# Patient Record
Sex: Female | Born: 1995 | Hispanic: No | Marital: Single | Smoking: Former smoker
Health system: Southern US, Community
[De-identification: ages and names within clinical notes are randomized; demographics above are authoritative.]

## PROBLEM LIST (undated history)

## (undated) DIAGNOSIS — F32A Depression, unspecified: Secondary | ICD-10-CM

## (undated) DIAGNOSIS — R7303 Prediabetes: Secondary | ICD-10-CM

## (undated) DIAGNOSIS — Z0282 Encounter for adoption services: Secondary | ICD-10-CM

## (undated) DIAGNOSIS — M549 Dorsalgia, unspecified: Secondary | ICD-10-CM

## (undated) DIAGNOSIS — F909 Attention-deficit hyperactivity disorder, unspecified type: Secondary | ICD-10-CM

## (undated) DIAGNOSIS — R51 Headache: Secondary | ICD-10-CM

## (undated) DIAGNOSIS — Z789 Other specified health status: Secondary | ICD-10-CM

## (undated) DIAGNOSIS — F329 Major depressive disorder, single episode, unspecified: Secondary | ICD-10-CM

## (undated) DIAGNOSIS — E119 Type 2 diabetes mellitus without complications: Secondary | ICD-10-CM

## (undated) DIAGNOSIS — K219 Gastro-esophageal reflux disease without esophagitis: Secondary | ICD-10-CM

## (undated) DIAGNOSIS — R519 Headache, unspecified: Secondary | ICD-10-CM

## (undated) DIAGNOSIS — F419 Anxiety disorder, unspecified: Secondary | ICD-10-CM

## (undated) HISTORY — PX: TONSILLECTOMY: SUR1361

## (undated) HISTORY — DX: Dorsalgia, unspecified: M54.9

## (undated) HISTORY — PX: GANGLION CYST EXCISION: SHX1691

## (undated) HISTORY — PX: WISDOM TOOTH EXTRACTION: SHX21

## (undated) HISTORY — DX: Headache: R51

## (undated) HISTORY — DX: Headache, unspecified: R51.9

---
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
## 2016-04-09 DIAGNOSIS — F329 Major depressive disorder, single episode, unspecified: Secondary | ICD-10-CM | POA: Insufficient documentation

## 2016-04-09 DIAGNOSIS — F419 Anxiety disorder, unspecified: Secondary | ICD-10-CM | POA: Insufficient documentation

## 2016-04-09 DIAGNOSIS — F909 Attention-deficit hyperactivity disorder, unspecified type: Secondary | ICD-10-CM | POA: Insufficient documentation

## 2016-04-09 DIAGNOSIS — F32A Depression, unspecified: Secondary | ICD-10-CM | POA: Insufficient documentation

## 2016-04-11 ENCOUNTER — Encounter
Admission: RE | Admit: 2016-04-11 | Discharge: 2016-04-11 | Disposition: A | Discharge status: Home / Self Care | Payer: 59 | Source: Ambulatory Visit | Attending: Surgery | Admitting: Surgery

## 2016-04-11 HISTORY — DX: Depression, unspecified: F32.A

## 2016-04-11 HISTORY — DX: Type 2 diabetes mellitus without complications: E11.9

## 2016-04-11 HISTORY — DX: Encounter for adoption services: Z02.82

## 2016-04-11 HISTORY — DX: Attention-deficit hyperactivity disorder, unspecified type: F90.9

## 2016-04-11 HISTORY — DX: Prediabetes: R73.03

## 2016-04-11 HISTORY — DX: Gastro-esophageal reflux disease without esophagitis: K21.9

## 2016-04-11 HISTORY — DX: Anxiety disorder, unspecified: F41.9

## 2016-04-11 HISTORY — DX: Other specified health status: Z78.9

## 2016-04-11 HISTORY — DX: Major depressive disorder, single episode, unspecified: F32.9

## 2016-04-11 NOTE — Patient Instructions (Signed)
  Your procedure is scheduled on: 04-17-16 Report to Same Day Surgery 2nd floor medical mall To find out your arrival time please call [phone] between 1PM - 3PM on 04-16-16  Remember: Instructions that are not followed completely may result in serious medical risk, up to and including death, or upon the discretion of your surgeon and anesthesiologist your surgery may need to be rescheduled.    _x___ 1. Do not eat food or drink liquids after midnight. No gum chewing or hard candies.     __x__ 2. No Alcohol for 24 hours before or after surgery.   __x__3. No Smoking for 24 prior to surgery.   ____  4. Bring all medications with you on the day of surgery if instructed.    __x__ 5. Notify your doctor if there is any change in your medical condition     (cold, fever, infections).     Do not wear jewelry, make-up, hairpins, clips or nail polish.  Do not wear lotions, powders, or perfumes. You may wear deodorant.  Do not shave 48 hours prior to surgery. Men may shave face and neck.  Do not bring valuables to the hospital.    [facility] any belongings or valuables.               Contacts, dentures or bridgework may not be worn into surgery.  Leave your suitcase in the car. After surgery it may be brought to your room.  For patients admitted to the hospital, discharge time is determined by your treatment team.   Patients discharged the day of surgery will not be allowed to drive home.    Please read over the following fact sheets that you were given:   [facility] Preparing for Surgery and or MRSA Information   ____ Take these medicines the morning of surgery with A SIP OF WATER:    1. NONE  2.  3.  4.  5.  6.  ____ Fleet Enema (as directed)   ____ Use CHG Soap or sage wipes as directed on instruction sheet   ____ Use inhalers on the day of surgery and bring to hospital day of surgery  __X__ Stop metformin 2 days prior to surgery-LAST DOSE ON 04-14-16,  MONDAY   ____ Take 1/2 of usual insulin dose the night before surgery and none on the morning of           surgery.   ____ Stop aspirin or coumadin, or plavix  _x__ Stop Anti-inflammatories such as Advil, Aleve, Ibuprofen, Motrin, Naproxen,          Naprosyn, Goodies powders or aspirin products NOW- Ok to take Tylenol.   ____ Stop supplements until after surgery.    ____ Bring C-Pap to the hospital.

## 2016-04-16 ENCOUNTER — Encounter: Payer: Self-pay | Admitting: *Deleted

## 2016-04-17 ENCOUNTER — Ambulatory Visit
Admission: RE | Admit: 2016-04-17 | Discharge: 2016-04-17 | Disposition: A | Discharge status: Home / Self Care | Payer: 59 | Source: Ambulatory Visit | Attending: Surgery | Admitting: Surgery

## 2016-04-17 ENCOUNTER — Ambulatory Visit: Payer: 59 | Admitting: Anesthesiology

## 2016-04-17 ENCOUNTER — Encounter
Admission: RE | Disposition: A | Discharge status: Home / Self Care | Payer: Self-pay | Source: Ambulatory Visit | Attending: Surgery

## 2016-04-17 ENCOUNTER — Encounter: Payer: Self-pay | Admitting: *Deleted

## 2016-04-17 DIAGNOSIS — F418 Other specified anxiety disorders: Secondary | ICD-10-CM | POA: Diagnosis not present

## 2016-04-17 DIAGNOSIS — R7303 Prediabetes: Secondary | ICD-10-CM | POA: Diagnosis not present

## 2016-04-17 DIAGNOSIS — K219 Gastro-esophageal reflux disease without esophagitis: Secondary | ICD-10-CM | POA: Diagnosis not present

## 2016-04-17 DIAGNOSIS — F329 Major depressive disorder, single episode, unspecified: Secondary | ICD-10-CM | POA: Insufficient documentation

## 2016-04-17 DIAGNOSIS — L0501 Pilonidal cyst with abscess: Secondary | ICD-10-CM | POA: Diagnosis present

## 2016-04-17 DIAGNOSIS — F909 Attention-deficit hyperactivity disorder, unspecified type: Secondary | ICD-10-CM | POA: Insufficient documentation

## 2016-04-17 DIAGNOSIS — Z79899 Other long term (current) drug therapy: Secondary | ICD-10-CM | POA: Insufficient documentation

## 2016-04-17 HISTORY — PX: PILONIDAL CYST EXCISION: SHX744

## 2016-04-17 LAB — GLUCOSE, CAPILLARY: Glucose-Capillary: 100 mg/dL — ABNORMAL HIGH (ref 65–99)

## 2016-04-17 LAB — POCT PREGNANCY, URINE: Preg Test, Ur: NEGATIVE

## 2016-04-17 SURGERY — EXCISION, PILONIDAL CYST, EXTENSIVE
Anesthesia: General | Wound class: Contaminated

## 2016-04-17 MED ORDER — LIDOCAINE HCL (CARDIAC) 20 MG/ML IV SOLN
INTRAVENOUS | Status: DC | PRN
  Administered 2016-04-17: 100 mg via INTRAVENOUS

## 2016-04-17 MED ORDER — FAMOTIDINE 20 MG PO TABS
ORAL_TABLET | ORAL | Status: AC
  Administered 2016-04-17: 20 mg via ORAL
  Filled 2016-04-17: qty 1

## 2016-04-17 MED ORDER — DEXAMETHASONE SODIUM PHOSPHATE 10 MG/ML IJ SOLN
INTRAMUSCULAR | Status: DC | PRN
  Administered 2016-04-17: 10 mg via INTRAVENOUS

## 2016-04-17 MED ORDER — HYDROMORPHONE HCL 1 MG/ML IJ SOLN
INTRAMUSCULAR | Status: DC | PRN
  Administered 2016-04-17: .5 mg via INTRAVENOUS

## 2016-04-17 MED ORDER — HYDROCODONE-ACETAMINOPHEN 5-325 MG PO TABS: 1.0000 | ORAL_TABLET | ORAL | Status: DC | PRN

## 2016-04-17 MED ORDER — FENTANYL CITRATE (PF) 100 MCG/2ML IJ SOLN
25.0000 ug | INTRAMUSCULAR | Status: DC | PRN
  Administered 2016-04-17 (×4): 25 ug via INTRAVENOUS

## 2016-04-17 MED ORDER — BUPIVACAINE-EPINEPHRINE (PF) 0.5% -1:200000 IJ SOLN
INTRAMUSCULAR | Status: DC | PRN
  Administered 2016-04-17: 7 mL via PERINEURAL

## 2016-04-17 MED ORDER — METHYLENE BLUE 0.5 % INJ SOLN
INTRAVENOUS | Status: AC
  Filled 2016-04-17: qty 10

## 2016-04-17 MED ORDER — SUGAMMADEX SODIUM 200 MG/2ML IV SOLN
INTRAVENOUS | Status: DC | PRN
  Administered 2016-04-17: 300 mg via INTRAVENOUS

## 2016-04-17 MED ORDER — FAMOTIDINE 20 MG PO TABS
20.0000 mg | ORAL_TABLET | Freq: Once | ORAL | Status: AC
  Administered 2016-04-17: 20 mg via ORAL

## 2016-04-17 MED ORDER — PROPOFOL 10 MG/ML IV BOLUS
INTRAVENOUS | Status: DC | PRN
  Administered 2016-04-17: 50 mg via INTRAVENOUS
  Administered 2016-04-17: 150 mg via INTRAVENOUS

## 2016-04-17 MED ORDER — ROCURONIUM BROMIDE 100 MG/10ML IV SOLN
INTRAVENOUS | Status: DC | PRN
  Administered 2016-04-17: 10 mg via INTRAVENOUS
  Administered 2016-04-17: 35 mg via INTRAVENOUS

## 2016-04-17 MED ORDER — ONDANSETRON HCL 4 MG/2ML IJ SOLN: 4.0000 mg | Freq: Once | INTRAMUSCULAR | Status: DC | PRN

## 2016-04-17 MED ORDER — DEXTROSE 5 % IV SOLN
2.0000 g | Freq: Once | INTRAVENOUS | Status: AC
  Administered 2016-04-17: 2000 mg via INTRAVENOUS
  Filled 2016-04-17: qty 2

## 2016-04-17 MED ORDER — BUPIVACAINE-EPINEPHRINE (PF) 0.5% -1:200000 IJ SOLN
INTRAMUSCULAR | Status: AC
  Filled 2016-04-17: qty 30

## 2016-04-17 MED ORDER — FENTANYL CITRATE (PF) 100 MCG/2ML IJ SOLN
INTRAMUSCULAR | Status: DC | PRN
Start: 2016-04-17 — End: 2016-04-17
  Administered 2016-04-17: 100 ug via INTRAVENOUS

## 2016-04-17 MED ORDER — SODIUM CHLORIDE 0.9 % IV SOLN
INTRAVENOUS | Status: DC
  Administered 2016-04-17: 10:00:00 via INTRAVENOUS

## 2016-04-17 MED ORDER — FENTANYL CITRATE (PF) 100 MCG/2ML IJ SOLN
INTRAMUSCULAR | Status: AC
  Administered 2016-04-17: 25 ug via INTRAVENOUS
  Filled 2016-04-17: qty 2

## 2016-04-17 MED ORDER — LACTATED RINGERS IV SOLN
INTRAVENOUS | Status: DC | PRN
  Administered 2016-04-17: 10:00:00 via INTRAVENOUS

## 2016-04-17 MED ORDER — HYDROCODONE-ACETAMINOPHEN 5-325 MG PO TABS: 1.0000 | ORAL_TABLET | ORAL | 0 refills | Status: DC | PRN

## 2016-04-17 MED ORDER — ONDANSETRON HCL 4 MG/2ML IJ SOLN
INTRAMUSCULAR | Status: DC | PRN
  Administered 2016-04-17: 4 mg via INTRAVENOUS

## 2016-04-17 SURGICAL SUPPLY — 22 items
BLADE CLIPPER SURG (BLADE) IMPLANT
CANISTER SUCT 1200ML W/VALVE (MISCELLANEOUS) ×2 IMPLANT
CHLORAPREP W/TINT 26ML (MISCELLANEOUS) IMPLANT
DRAPE LAPAROTOMY 100X77 ABD (DRAPES) ×2 IMPLANT
ELECT REM PT RETURN 9FT ADLT (ELECTROSURGICAL) ×2
ELECTRODE REM PT RTRN 9FT ADLT (ELECTROSURGICAL) ×1 IMPLANT
GAUZE SPONGE 4X4 12PLY STRL (GAUZE/BANDAGES/DRESSINGS) ×2 IMPLANT
GLOVE BIO SURGEON STRL SZ7.5 (GLOVE) ×6 IMPLANT
GOWN STRL REUS W/ TWL LRG LVL3 (GOWN DISPOSABLE) ×3 IMPLANT
GOWN STRL REUS W/TWL LRG LVL3 (GOWN DISPOSABLE) ×3
LABEL OR SOLS (LABEL) IMPLANT
NEEDLE HYPO 25X1 1.5 SAFETY (NEEDLE) ×2 IMPLANT
NS IRRIG 500ML POUR BTL (IV SOLUTION) ×2 IMPLANT
PACK BASIN MINOR ARMC (MISCELLANEOUS) ×2 IMPLANT
PENCIL ELECTRO HAND CTR (MISCELLANEOUS) ×2 IMPLANT
STRAP SAFETY BODY (MISCELLANEOUS) ×2 IMPLANT
SUT CHROMIC 2 0 CT 1 (SUTURE) IMPLANT
SUT CHROMIC 3 0 SH 27 (SUTURE) IMPLANT
SUT ETHILON 3-0 FS-10 30 BLK (SUTURE) ×2
SUTURE EHLN 3-0 FS-10 30 BLK (SUTURE) ×1 IMPLANT
SWABSTK COMLB BENZOIN TINCTURE (MISCELLANEOUS) IMPLANT
SYRINGE 10CC LL (SYRINGE) ×2 IMPLANT

## 2016-04-17 NOTE — Op Note (Signed)
OPERATIVE REPORT  PREOPERATIVE  DIAGNOSIS: . Pilonidal cyst  POSTOPERATIVE DIAGNOSIS: . Pilonidal cyst  PROCEDURE: . Excision pilonidal cyst  ANESTHESIA:  General  SURGEON: Renda RollsWilton Alassane Kalafut  MD   INDICATIONS: . She has a history of multiple episodes of pain and drainage of pilonidal cyst. Surgery is recommended for definitive treatment.  With the patient on a stretcher in the supine position under general endotracheal anesthesia she was then rolled over to the operating table in the prone position. Buttocks were retracted with 3 inch silk tape. The intergluteal cleft was repaired with Betadine solution and draped in a sterile manner. There were 3 pores noted in the skin. A probe was inserted into the most inferior pore which went in a cephalad direction approximately 3 cm  demonstrating the extent of the cyst. An elliptical excision was carried out from just below the pore to just above the drainage site which was oriented slightly to the left of midline. The excision was then carried out with electrocautery dissecting around the probe and around the cyst. The cyst was excised. The cyst and surrounding skin and adipose tissue were submitted for pathology. The wound was inspected. Several small bleeding points were cauterized. Hemostasis was subsequently intact. The wound was infiltrated with half percent Sensorcaine with epinephrine. The wound was closed with interrupted 3-0 nylon vertical mattress sutures placing the sutures far enough apart to allow for drainage. The wound was dressed with Betadine paint, folded cotton gauze, benzoin and 3 inch silk tape. The patient tolerated surgery satisfactorily and was then prepared for transfer to the recovery room  [facility] Lauriel Helin M.D.

## 2016-04-17 NOTE — Discharge Instructions (Addendum)
Take Tylenol or Norco if needed for pain.  Remove dressing on Friday.  Shower daily and blot dry.  Limit activities as discussed.

## 2016-04-17 NOTE — Progress Notes (Signed)
.   Smith into see and check   Dressing dry and pt ready for discharge without complaint

## 2016-04-17 NOTE — Anesthesia Procedure Notes (Signed)
Procedure Name: Intubation Date/Time: 04/17/2016 10:36 AM Performed by: Almeta MonasFLETCHER, Shanetta Nicolls Pre-anesthesia Checklist: Patient identified, Emergency Drugs available, Suction available and Patient being monitored Patient Re-evaluated:Patient Re-evaluated prior to inductionOxygen Delivery Method: Circle system utilized Preoxygenation: Pre-oxygenation with 100% oxygen Intubation Type: IV induction Ventilation: Mask ventilation without difficulty Laryngoscope Size: Mac and 3 Grade View: Grade I Tube type: Oral Tube size: 7.0 mm Number of attempts: 1 Airway Equipment and Method: Stylet Placement Confirmation: ETT inserted through vocal cords under direct vision,  positive ETCO2,  CO2 detector and breath sounds checked- equal and bilateral Secured at: 19 cm Tube secured with: Tape Dental Injury: Teeth and Oropharynx as per pre-operative assessment

## 2016-04-17 NOTE — Anesthesia Preprocedure Evaluation (Signed)
Anesthesia Evaluation  Patient identified by MRN, date of birth, ID band Patient awake    Reviewed: Allergy & Precautions, NPO status , Patient's Chart, lab work & pertinent test results  History of Anesthesia Complications Negative for: history of anesthetic complications  Airway Mallampati: II       Dental   Pulmonary neg pulmonary ROS,           Cardiovascular negative cardio ROS       Neuro/Psych Anxiety Depression negative neurological ROS     GI/Hepatic Neg liver ROS, GERD  Medicated,  Endo/Other  diabetes, Oral Hypoglycemic Agents  Renal/GU negative Renal ROS     Musculoskeletal   Abdominal   Peds  Hematology negative hematology ROS (+)   Anesthesia Other Findings   Reproductive/Obstetrics                             Anesthesia Physical Anesthesia Plan  ASA: II  Anesthesia Plan: General   Post-op Pain Management:    Induction: Intravenous  Airway Management Planned: Oral ETT  Additional Equipment:   Intra-op Plan:   Post-operative Plan:   Informed Consent: I have reviewed the patients History and Physical, chart, labs and discussed the procedure including the risks, benefits and alternatives for the proposed anesthesia with the patient or authorized representative who has indicated his/her understanding and acceptance.     Plan Discussed with:   Anesthesia Plan Comments:         Anesthesia Quick Evaluation

## 2016-04-17 NOTE — Transfer of Care (Signed)
Immediate Anesthesia Transfer of Care Note  Patient: Allison Austin  Procedure(s) Performed: Procedure(s): CYST EXCISION PILONIDAL EXTENSIVE (N/A)  Patient Location: PACU  Anesthesia Type:General  Level of Consciousness: sedated  Airway & Oxygen Therapy: Patient Spontanous Breathing and Patient connected to face mask oxygen  Post-op Assessment: Report given to RN and Post -op Vital signs reviewed and stable  Post vital signs: Reviewed and stable  Last Vitals:  Vitals:   04/17/16 0948 04/17/16 1148  BP: 114/67 127/89  Pulse: 85 96  Resp:  14  Temp: 36.6 C 36.1 C    Last Pain:  Vitals:   04/17/16 0948  TempSrc: Oral         Complications: No apparent anesthesia complications

## 2016-04-17 NOTE — Anesthesia Postprocedure Evaluation (Signed)
Anesthesia Post Note  Patient: Allison Austin  Procedure(s) Performed: Procedure(s) (LRB): CYST EXCISION PILONIDAL EXTENSIVE (N/A)  Patient location during evaluation: PACU Anesthesia Type: General Level of consciousness: awake and alert Pain management: pain level controlled Vital Signs Assessment: post-procedure vital signs reviewed and stable Respiratory status: spontaneous breathing and respiratory function stable Cardiovascular status: stable Anesthetic complications: no    Last Vitals:  Vitals:   04/17/16 1208 04/17/16 1218  BP: 132/80 123/81  Pulse: 79 76  Resp: 15 15  Temp:      Last Pain:  Vitals:   04/17/16 1220  TempSrc:   PainSc: 2                  Miquel Lamson K

## 2016-04-17 NOTE — H&P (Signed)
  She reports continued discomfort in the intergluteal cleft that no change in overall condition since the day of the office visit.  CBC on 04/08/2016 with hemoglobin of 13.7 and platelet count 307,000. Chemistry profile was normal with creatinine of 0.7. Urine pregnancy test today is negative  I discussed the plan for excision of pilonidal cyst and also discussed perioperative care

## 2016-04-18 LAB — SURGICAL PATHOLOGY

## 2016-04-18 NOTE — Addendum Note (Signed)
Addendum  created 04/18/16 1404 by Almeta MonasPatricia Chezney Huether, CRNA   Anesthesia Intra Meds edited

## 2016-04-22 ENCOUNTER — Encounter: Payer: Self-pay | Admitting: Surgery

## 2016-06-17 ENCOUNTER — Encounter: Payer: Self-pay | Admitting: Emergency Medicine

## 2016-06-17 ENCOUNTER — Emergency Department
Admission: EM | Admit: 2016-06-17 | Discharge: 2016-06-17 | Disposition: A | Discharge status: Home / Self Care | Payer: 59 | Attending: Student in an Organized Health Care Education/Training Program | Admitting: Student in an Organized Health Care Education/Training Program

## 2016-06-17 DIAGNOSIS — R55 Syncope and collapse: Secondary | ICD-10-CM | POA: Insufficient documentation

## 2016-06-17 DIAGNOSIS — Z7984 Long term (current) use of oral hypoglycemic drugs: Secondary | ICD-10-CM | POA: Insufficient documentation

## 2016-06-17 DIAGNOSIS — Z79899 Other long term (current) drug therapy: Secondary | ICD-10-CM | POA: Diagnosis not present

## 2016-06-17 DIAGNOSIS — E119 Type 2 diabetes mellitus without complications: Secondary | ICD-10-CM | POA: Insufficient documentation

## 2016-06-17 DIAGNOSIS — F909 Attention-deficit hyperactivity disorder, unspecified type: Secondary | ICD-10-CM | POA: Diagnosis not present

## 2016-06-17 LAB — URINALYSIS COMPLETE WITH MICROSCOPIC (ARMC ONLY)
Bilirubin Urine: NEGATIVE
Glucose, UA: NEGATIVE mg/dL
Hgb urine dipstick: NEGATIVE
Ketones, ur: NEGATIVE mg/dL
Nitrite: NEGATIVE
Protein, ur: NEGATIVE mg/dL
Specific Gravity, Urine: 1.014 (ref 1.005–1.030)
pH: 6 (ref 5.0–8.0)

## 2016-06-17 LAB — CBC
HCT: 42.2 % (ref 35.0–47.0)
Hemoglobin: 14.3 g/dL (ref 12.0–16.0)
MCH: 28.2 pg (ref 26.0–34.0)
MCHC: 33.9 g/dL (ref 32.0–36.0)
MCV: 83.2 fL (ref 80.0–100.0)
Platelets: 309 10*3/uL (ref 150–440)
RBC: 5.07 MIL/uL (ref 3.80–5.20)
RDW: 14.8 % — ABNORMAL HIGH (ref 11.5–14.5)
WBC: 10.6 10*3/uL (ref 3.6–11.0)

## 2016-06-17 LAB — POCT PREGNANCY, URINE: Preg Test, Ur: NEGATIVE

## 2016-06-17 LAB — BASIC METABOLIC PANEL
Anion gap: 8 (ref 5–15)
BUN: 8 mg/dL (ref 6–20)
CHLORIDE: 108 mmol/L (ref 101–111)
CO2: 22 mmol/L (ref 22–32)
Calcium: 9.2 mg/dL (ref 8.9–10.3)
Creatinine, Ser: 0.67 mg/dL (ref 0.44–1.00)
Glucose, Bld: 97 mg/dL (ref 65–99)
POTASSIUM: 3.9 mmol/L (ref 3.5–5.1)
SODIUM: 138 mmol/L (ref 135–145)

## 2016-06-17 LAB — LIPASE, BLOOD: Lipase: 28 U/L (ref 11–51)

## 2016-06-17 MED ORDER — NITROFURANTOIN MONOHYD MACRO 100 MG PO CAPS: 100.0000 mg | ORAL_CAPSULE | Freq: Two times a day (BID) | ORAL | 0 refills | Status: AC

## 2016-06-17 MED ORDER — PROMETHAZINE HCL 25 MG PO TABS
25.0000 mg | ORAL_TABLET | Freq: Once | ORAL | Status: AC
  Administered 2016-06-17: 25 mg via ORAL
  Filled 2016-06-17: qty 1

## 2016-06-17 NOTE — ED Notes (Signed)
Pt presents after having "blackout" for 6 seconds while on the toilet this morning. Pt called her mother, who told her to take her blood sugar, which was normal. She called her MD office, who told her to come here. Pt states she may have alcohol poisoning from drinking on Friday. She has not had a drink since then, but states that she drank "a lot" on Friday. She states that her vision is blurred and that she has had palpitations today. She also states she has had a headache since this morning.

## 2016-06-17 NOTE — ED Notes (Signed)
Pt discharged home after verbalizing understanding of discharge instructions; nad noted. 

## 2016-06-17 NOTE — ED Triage Notes (Signed)
States had a syncopal episode this morning while on the toilet.  Later began to have a headache.  Also c/o intermittent blurred vision and heart palpitations.  Patient also c/o 1 episode ov emesis just PTA and nausea.

## 2016-06-17 NOTE — ED Provider Notes (Signed)
[facility] Emergency Department Provider Note    First MD Initiated Contact with Patient 06/17/16 1354     (approximate)  I have reviewed the triage vital signs and the nursing notes.   HISTORY  Chief Complaint Loss of Consciousness    HPI Etta Truett MainlandOBryan is a 20 y.o. female who presents with near syncopal episode that occurred this morning while she was urinating on the toilet. States that she felt her vision blacked out for roughly 5-6 seconds. She did not fully lose consciousness that she did not fall from the toilet. She denies any chest pain or shortness of breath. States that she was feeling nauseous this morning. No dysuria.  She felt that her heart was racing and called her PCP who directed her to the Er.  Patient states that she's been having blurry vision since she was a young child. Denies any visual disturbances at this time. Denies any headache at this time. Denies any chest pain or shortness of breath. No numbness or tingling.   Past Medical History:  Diagnosis Date  . ADHD (attention deficit hyperactivity disorder)   . Adopted   . Anxiety   . Depression   . Diabetes mellitus without complication (HCC)   . GERD (gastroesophageal reflux disease)    OCC- NO MEDS  . Pre-diabetes     There are no active problems to display for this patient.   Past Surgical History:  Procedure Laterality Date  . GANGLION CYST EXCISION    . PILONIDAL CYST EXCISION N/A 04/17/2016   Procedure: CYST EXCISION PILONIDAL EXTENSIVE;  Surgeon: Nadeen LandauJarvis Wilton Smith, MD;  Location: [facility] ORS;  Service: General;  Laterality: N/A;  . TONSILLECTOMY    . WISDOM TOOTH EXTRACTION      Prior to Admission medications   Medication Sig Start Date End Date Taking? Authorizing Provider  Amphetamine-Dextroamphetamine (ADDERALL PO) Take 25 mg by mouth daily.    Historical Provider, MD  citalopram (CELEXA) 40 MG tablet Take 40 mg by mouth at bedtime.  04/12/15   Historical Provider,  MD  doxycycline (VIBRA-TABS) 100 MG tablet Take 100 mg by mouth 2 (two) times daily.    Historical Provider, MD  etonogestrel (NEXPLANON) 68 MG IMPL implant 1 each by Subdermal route once.    Historical Provider, MD  HYDROcodone-acetaminophen (NORCO) 5-325 MG tablet Take 1-2 tablets by mouth every 4 (four) hours as needed for moderate pain. 04/17/16   Nadeen LandauJarvis Wilton Smith, MD  metFORMIN (GLUCOPHAGE) 500 MG tablet Take 1,000 mg by mouth daily.    Historical Provider, MD  nitrofurantoin, macrocrystal-monohydrate, (MACROBID) 100 MG capsule Take 1 capsule (100 mg total) by mouth 2 (two) times daily. 06/17/16 06/20/16  Willy EddyPatrick Demitrius Crass, MD  Omega-3 Fatty Acids (FISH OIL) 1000 MG CAPS Take 1 capsule by mouth daily.    Historical Provider, MD    Allergies Patient has no known allergies.  No family history on file.  Social History Social History  Substance Use Topics  . Smoking status: Never Smoker  . Smokeless tobacco: Never Used  . Alcohol use No    Review of Systems Patient denies headaches, rhinorrhea, blurry vision, numbness, shortness of breath, chest pain, edema, cough, abdominal pain, nausea, vomiting, diarrhea, dysuria, fevers, rashes or hallucinations unless otherwise stated above in HPI. ____________________________________________   PHYSICAL EXAM:  VITAL SIGNS: Vitals:   06/17/16 1217 06/17/16 1452  BP: 114/80 (!) 119/92  Pulse: 94 96  Resp: 16 18    Constitutional: Alert and oriented. Well appearing  and in no acute distress. Eyes: Conjunctivae are normal. PERRL. EOMI. Head: Atraumatic. Nose: No congestion/rhinnorhea. Mouth/Throat: Mucous membranes are moist.  Oropharynx non-erythematous. Neck: No stridor. Painless ROM. No cervical spine tenderness to palpation Hematological/Lymphatic/Immunilogical: No cervical lymphadenopathy. Cardiovascular: Normal rate, regular rhythm. Grossly normal heart sounds.  Good peripheral circulation. Respiratory: Normal respiratory effort.   No retractions. Lungs CTAB. Gastrointestinal: Soft and nontender. No distention. No abdominal bruits. No CVA tenderness. Genitourinary:  Musculoskeletal: No lower extremity tenderness nor edema.  No joint effusions. Neurologic:  CN- intact.  No facial droop, Normal FNF.  Normal heel to shin.  Sensation intact bilaterally. Normal speech and language. No gross focal neurologic deficits are appreciated. No gait instability. Skin:  Skin is warm, dry and intact. No rash noted. Psychiatric: Mood and affect are normal. Speech and behavior are normal.  ____________________________________________   LABS (all labs ordered are listed, but only abnormal results are displayed)  Results for orders placed or performed during the hospital encounter of 06/17/16 (from the past 24 hour(s))  Basic metabolic panel     Status: None   Collection Time: 06/17/16 12:20 PM  Result Value Ref Range   Sodium 138 135 - 145 mmol/L   Potassium 3.9 3.5 - 5.1 mmol/L   Chloride 108 101 - 111 mmol/L   CO2 22 22 - 32 mmol/L   Glucose, Bld 97 65 - 99 mg/dL   BUN 8 6 - 20 mg/dL   Creatinine, Ser 9.60 0.44 - 1.00 mg/dL   Calcium 9.2 8.9 - 45.4 mg/dL   GFR calc non Af Amer >60 >60 mL/min   GFR calc Af Amer >60 >60 mL/min   Anion gap 8 5 - 15  CBC     Status: Abnormal   Collection Time: 06/17/16 12:20 PM  Result Value Ref Range   WBC 10.6 3.6 - 11.0 K/uL   RBC 5.07 3.80 - 5.20 MIL/uL   Hemoglobin 14.3 12.0 - 16.0 g/dL   HCT 09.8 11.9 - 14.7 %   MCV 83.2 80.0 - 100.0 fL   MCH 28.2 26.0 - 34.0 pg   MCHC 33.9 32.0 - 36.0 g/dL   RDW 82.9 (H) 56.2 - 13.0 %   Platelets 309 150 - 440 K/uL  Urinalysis complete, with microscopic     Status: Abnormal   Collection Time: 06/17/16 12:20 PM  Result Value Ref Range   Color, Urine YELLOW (A) YELLOW   APPearance HAZY (A) CLEAR   Glucose, UA NEGATIVE NEGATIVE mg/dL   Bilirubin Urine NEGATIVE NEGATIVE   Ketones, ur NEGATIVE NEGATIVE mg/dL   Specific Gravity, Urine 1.014 1.005 -  1.030   Hgb urine dipstick NEGATIVE NEGATIVE   pH 6.0 5.0 - 8.0   Protein, ur NEGATIVE NEGATIVE mg/dL   Nitrite NEGATIVE NEGATIVE   Leukocytes, UA TRACE (A) NEGATIVE   RBC / HPF 0-5 0 - 5 RBC/hpf   WBC, UA 0-5 0 - 5 WBC/hpf   Bacteria, UA MANY (A) NONE SEEN   Squamous Epithelial / LPF 0-5 (A) NONE SEEN   Mucous PRESENT   Lipase, blood     Status: None   Collection Time: 06/17/16 12:20 PM  Result Value Ref Range   Lipase 28 11 - 51 U/L  Pregnancy, urine POC     Status: None   Collection Time: 06/17/16 12:38 PM  Result Value Ref Range   Preg Test, Ur NEGATIVE NEGATIVE   ____________________________________________  EKG My review and personal interpretation at Time: 12:29   Indication: syncope  Rate: 79  Rhythm: sinuis Axis: normal Other: normal intervals, no brugada or wpw ____________________________________________   ____________________________________________   PROCEDURES  Procedure(s) performed: none    Critical Care performed: no ____________________________________________   INITIAL IMPRESSION / ASSESSMENT AND PLAN / ED COURSE  Pertinent labs & imaging results that were available during my care of the patient were reviewed by me and considered in my medical decision making (see chart for details).  DDX: dehydration, vasovagal, dysrhythmia, pna, electrolyte abn  Mieke Truett MainlandOBryan is a 20 y.o. who presents to the ED with Near syncopal episode as described above.  Patient is AFVSS in ED. Exam as above. Given current presentation have considered the above differential.  Patient is well-appearing and in no acute distress. Electrolyte panel ordered to evaluate for acute abnormality is reassuring. EKG shows no evidence of dysrhythmia or preexcitation syndrome.  Patient denies any shortness of breath to indicate any pulmonary pathology. She is normal neuro exam with normal crisp optic disks bilaterally. Do not feel CT imaging clinically indicated at this time.   Presentation most consistent with vasovagal episode. No evidence of seizure-like activity.  Patient is not pregnant therefore not ectopic.  Patient able to tolerate oral hydration and able to [facility] steady gait.  Have discussed with the patient and available family all diagnostics and treatments performed thus far and all questions were answered to the best of my ability. The patient demonstrates understanding and agreement with plan.   Clinical Course as of Jun 17 1629  Tue Jun 17, 2016  1355 Pulse Rate: 94 [PR]    Clinical Course User Index [PR] Willy EddyPatrick Zyiah Withington, MD     ____________________________________________   FINAL CLINICAL IMPRESSION(S) / ED DIAGNOSES  Final diagnoses:  Near syncope      NEW MEDICATIONS STARTED DURING THIS VISIT:  Discharge Medication List as of 06/17/2016  2:42 PM       Note:  This document was prepared using Dragon voice recognition software and may include unintentional dictation errors.    Willy EddyPatrick Quanell Loughney, MD 06/17/16 [phone]

## 2017-07-06 ENCOUNTER — Encounter: Payer: Self-pay | Admitting: Obstetrics and Gynecology

## 2017-07-06 ENCOUNTER — Ambulatory Visit (INDEPENDENT_AMBULATORY_CARE_PROVIDER_SITE_OTHER): Payer: 59 | Admitting: Obstetrics and Gynecology

## 2017-07-06 VITALS — BP 130/70 | HR 104 | Ht 62.0 in | Wt 255.0 lb

## 2017-07-06 DIAGNOSIS — Z0000 Encounter for general adult medical examination without abnormal findings: Secondary | ICD-10-CM

## 2017-07-06 DIAGNOSIS — Z3046 Encounter for surveillance of implantable subdermal contraceptive: Secondary | ICD-10-CM

## 2017-07-06 DIAGNOSIS — Z113 Encounter for screening for infections with a predominantly sexual mode of transmission: Secondary | ICD-10-CM

## 2017-07-06 NOTE — Progress Notes (Signed)
Gynecology Annual Exam  PCP: Patient, No Pcp Per  Chief Complaint:  Chief Complaint  Patient presents with  . Gynecologic Exam    History of Present Illness: Patient is a 21 y.o. G0P0000 presents for annual exam. The patient has no complaints today.   LMP: Patient's last menstrual period was 06/27/2017 (exact date). Average Interval: irregular, not applicable days Duration of flow: 2 days Heavy Menses: no Clots: no Intermenstrual Bleeding: no Postcoital Bleeding: no Dysmenorrhea: no   The patient is not currently sexually active. She currently uses Nexplanon for contraception. She denies dyspareunia.  The patient does perform self breast exams.  The patient is adopted so she is not sure about her family history of breast, uterine, or ovarian cancer.   The patient wears seatbelts: yes.   The patient walks 2-3 times a week with a friend for exercise..    The patient denies current symptoms of depression.    Review of Systems: Review of Systems  Constitutional: Negative for chills, fever, malaise/fatigue and weight loss.  HENT: Negative for congestion, hearing loss and sinus pain.   Eyes: Negative for blurred vision and double vision.  Respiratory: Negative for cough, sputum production, shortness of breath and wheezing.   Cardiovascular: Negative for chest pain, palpitations, orthopnea and leg swelling.  Gastrointestinal: Negative for abdominal pain, constipation, diarrhea, nausea and vomiting.  Genitourinary: Negative for dysuria, flank pain, frequency, hematuria and urgency.  Musculoskeletal: Negative for back pain, falls and joint pain.  Skin: Negative for itching and rash.  Neurological: Negative for dizziness and headaches.  Psychiatric/Behavioral: Negative for depression, substance abuse and suicidal ideas. The patient is not nervous/anxious.     Past Medical History:  Past Medical History:  Diagnosis Date  . ADHD (attention deficit hyperactivity disorder)   .  Adopted   . Anxiety   . Depression   . Diabetes mellitus without complication (HCC)   . GERD (gastroesophageal reflux disease)    OCC- NO MEDS  . Pre-diabetes     Past Surgical History:  Past Surgical History:  Procedure Laterality Date  . GANGLION CYST EXCISION    . PILONIDAL CYST EXCISION N/A 04/17/2016   Procedure: CYST EXCISION PILONIDAL EXTENSIVE;  Surgeon: Nadeen LandauJarvis Wilton Smith, MD;  Location: [facility] ORS;  Service: General;  Laterality: N/A;  . TONSILLECTOMY    . WISDOM TOOTH EXTRACTION      Gynecologic History:  Patient's last menstrual period was 06/27/2017 (exact date). Contraception: Nexplanon Last Pap: Not applicable, patient is under 21.  Obstetric History: G0P0000  Family History:  Family History  Adopted: Yes  Problem Relation Age of Onset  . Testicular cancer Maternal Uncle   . Diabetes Maternal Grandmother   . Uterine cancer Maternal Grandmother     Social History:  Social History   Socioeconomic History  . Marital status: Single    Spouse name: Not on file  . Number of children: Not on file  . Years of education: Not on file  . Highest education level: Not on file  Social Needs  . Financial resource strain: Not on file  . Food insecurity - worry: Not on file  . Food insecurity - inability: Not on file  . Transportation needs - medical: Not on file  . Transportation needs - non-medical: Not on file  Occupational History  . Not on file  Tobacco Use  . Smoking status: Never Smoker  . Smokeless tobacco: Never Used  Substance and Sexual Activity  . Alcohol use: No  Alcohol/week: 0.0 oz  . Drug use: No  . Sexual activity: Not Currently    Birth control/protection: Implant  Other Topics Concern  . Not on file  Social History Narrative  . Not on file    Allergies:  No Known Allergies  Medications: Prior to Admission medications   Medication Sig Start Date End Date Taking? Authorizing Provider  etonogestrel (NEXPLANON) 68 MG IMPL implant 1  each by Subdermal route once.    Yes [provider]  Multiple Vitamin (MULTI-VITAMINS) TABS Take by mouth.   Yes [provider]  triamcinolone cream (KENALOG) 0.1 %  05/13/17  Yes [provider]    Physical Exam Vitals: Blood pressure 130/70, pulse (!) 104, height 5\' 2"  (1.575 m), weight 255 lb (115.7 kg), last menstrual period 06/27/2017.  Physical Exam  Constitutional: She is oriented to person, place, and time. She appears well-developed.  Genitourinary: Vagina normal and uterus normal. There is no rash, tenderness or lesion on the right labia. There is no rash, tenderness or lesion on the left labia. Vagina exhibits no lesion. Right adnexum does not display mass. Left adnexum does not display mass. Cervix does not exhibit motion tenderness.  HENT:  Head: Normocephalic and atraumatic.  Eyes: EOM are normal.  Neck: Neck supple. No thyromegaly present.  Cardiovascular: Normal rate, regular rhythm and normal heart sounds.  Pulmonary/Chest: Effort normal and breath sounds normal. Right breast exhibits no inverted nipple, no mass, no nipple discharge and no skin change. Left breast exhibits no inverted nipple, no mass, no nipple discharge and no skin change.  Abdominal: Soft. Bowel sounds are normal. She exhibits no distension and no mass.  Neurological: She is alert and oriented to person, place, and time.  Skin: Skin is warm and dry.  Psychiatric: She has a normal mood and affect. Her behavior is normal. Judgment and thought content normal.  Vitals reviewed.    Female chaperone present for pelvic and breast  portions of the physical exam  Assessment: 21 y.o. G0P0000 routine annual exam  Plan: Problem List Items Addressed This Visit    None    Visit Diagnoses    Screening examination for STD (sexually transmitted disease)    -  Primary   Relevant Orders   NuSwab Vaginitis Plus (VG+)   Health care maintenance       Relevant Orders   NuSwab Vaginitis  Plus (VG+)      1) STI screening was offered and accepted. Nuswab VG+ sent.    2) Contraception - Education given regarding options for contraception, including barrier methods, IUD placement, oral contraceptives, Nexplanon. She desires to continue her current form of contraception with the Nexplanon.  3) Routine healthcare maintenance including cholesterol, diabetes screening discussed managed by PCP   Patient gave permission to leave detailed verbal messages on her phone regarding her care today.

## 2017-07-09 ENCOUNTER — Other Ambulatory Visit: Payer: Self-pay | Admitting: Obstetrics and Gynecology

## 2017-07-09 LAB — NUSWAB VAGINITIS PLUS (VG+)
CANDIDA ALBICANS, NAA: POSITIVE — AB
CANDIDA GLABRATA, NAA: NEGATIVE
Chlamydia trachomatis, NAA: NEGATIVE
Neisseria gonorrhoeae, NAA: NEGATIVE
TRICH VAG BY NAA: NEGATIVE

## 2017-07-09 MED ORDER — FLUCONAZOLE 150 MG PO TABS: 150.0000 mg | ORAL_TABLET | Freq: Once | ORAL | 0 refills | Status: AC

## 2017-07-30 ENCOUNTER — Telehealth: Payer: Self-pay | Admitting: Obstetrics and Gynecology

## 2017-07-30 ENCOUNTER — Encounter: Payer: Self-pay | Admitting: Obstetrics and Gynecology

## 2017-07-30 ENCOUNTER — Other Ambulatory Visit: Payer: Self-pay | Admitting: Obstetrics and Gynecology

## 2017-07-30 DIAGNOSIS — N760 Acute vaginitis: Secondary | ICD-10-CM

## 2017-07-30 MED ORDER — TERCONAZOLE 0.4 % VA CREA: 1.0000 | TOPICAL_CREAM | Freq: Every day | VAGINAL | 0 refills | Status: DC

## 2017-07-30 NOTE — Telephone Encounter (Signed)
Patient scheduled 12/27 for nexplanon remove/replace with Schuman.

## 2017-07-30 NOTE — Progress Notes (Signed)
Discussed with patient on the phone. Patient says her symptoms of vaginitis have not improved. Will send prescription for a vaginal medication. Discussed with patient that this could be a sign that she has developed diabetes. She says that she has an endocrinologist but that she hasn't followed up with him in a year. She missed her appointment with her endocrinologist in October because of work. She is having new onset irregular menstrual bleeding. Might be related to break through from the Nexplanon. She can come have the Nexplanon replaced and see if that improves symptoms or she will need further evaluation of her abnormal uterine bleeding.

## 2017-07-30 NOTE — Telephone Encounter (Signed)
Discussed with patient over phone. Sent Rx to pharmacy. She will schedule follow up.

## 2017-08-06 ENCOUNTER — Ambulatory Visit (INDEPENDENT_AMBULATORY_CARE_PROVIDER_SITE_OTHER): Payer: 59 | Admitting: Obstetrics and Gynecology

## 2017-08-06 VITALS — BP 122/84 | Ht 62.0 in | Wt 242.0 lb

## 2017-08-06 DIAGNOSIS — N921 Excessive and frequent menstruation with irregular cycle: Secondary | ICD-10-CM

## 2017-08-06 DIAGNOSIS — Z975 Presence of (intrauterine) contraceptive device: Secondary | ICD-10-CM

## 2017-08-06 DIAGNOSIS — Z978 Presence of other specified devices: Secondary | ICD-10-CM

## 2017-08-06 DIAGNOSIS — Z3046 Encounter for surveillance of implantable subdermal contraceptive: Secondary | ICD-10-CM | POA: Diagnosis not present

## 2017-08-06 DIAGNOSIS — Z30017 Encounter for initial prescription of implantable subdermal contraceptive: Secondary | ICD-10-CM

## 2017-08-06 NOTE — Progress Notes (Signed)
  Nexplanon removal and reinsertion  Patient has been having break through bleeding with device. Device is due for replacement in 3 months, placed new device today because of bleeding.   Procedure note - The Nexplanon was noted in the patient's arm and the end was identified. The skin was cleansed with an alcohol swab.  A small injection of subcutaneous lidocaine with epinephrine was given over the end of the implant. The skin was cleaned with betadine.  An incision was made at the end of the implant. The rod was noted in the incision and grasped with a hemostat. It was noted to be intact.   Nexplanon removed form packaging,  Device confirmed in needle, then inserted full length of needle and withdrawn per handbook instructions.  Pt insertion site covered with surgical glue.   Minimal blood loss.  Pt tolerated the procedure well.  Implant was palpated in patients arm.    Natale Milchhristanna R Schuman ,MD 08/06/2017,3:30 PM

## 2017-08-11 DIAGNOSIS — E119 Type 2 diabetes mellitus without complications: Secondary | ICD-10-CM

## 2017-08-11 HISTORY — DX: Type 2 diabetes mellitus without complications: E11.9

## 2017-10-06 NOTE — Telephone Encounter (Signed)
Nexplanon received 08/06/17

## 2017-12-14 ENCOUNTER — Encounter: Payer: Self-pay | Attending: Student | Admitting: Dietician

## 2017-12-14 ENCOUNTER — Encounter: Payer: Self-pay | Admitting: Dietician

## 2017-12-14 VITALS — BP 116/76 | Ht 62.0 in | Wt 221.2 lb

## 2017-12-14 DIAGNOSIS — Z713 Dietary counseling and surveillance: Secondary | ICD-10-CM | POA: Insufficient documentation

## 2017-12-14 DIAGNOSIS — E119 Type 2 diabetes mellitus without complications: Secondary | ICD-10-CM

## 2017-12-14 DIAGNOSIS — E1165 Type 2 diabetes mellitus with hyperglycemia: Secondary | ICD-10-CM | POA: Insufficient documentation

## 2017-12-14 NOTE — Progress Notes (Signed)
Diabetes Self-Management Education  Visit Type: First/Initial  Appt. Start Time: 1845 Appt. End Time: 2000  12/14/2017  Allison Austin, identified by name and date of birth, is a 22 y.o. female with a diagnosis of Diabetes: Type 2.   ASSESSMENT  Blood pressure 116/76, height  (1.575 m), weight 221 lb 3.2 oz (100.3 kg). Body mass index is 40.46 kg/m.  Diabetes Self-Management Education - 12/14/17 2019      Visit Information   Visit Type  First/Initial      Initial Visit   Diabetes Type  Type 2      Health Coping   How would you rate your overall health?  Fair      Psychosocial Assessment   Patient Belief/Attitude about Diabetes  Motivated to manage diabetes    Self-care barriers  None    Self-management support  Doctor's office;Family    Other persons present  Patient;Parent    Patient Concerns  Glycemic Control;Medication;Weight Control;Healthy Lifestyle prevent complications, become more fit    Special Needs  None    Preferred Learning Style  Hands on;Visual;Auditory    Learning Readiness  Ready    What is the last grade level you completed in school?  12      Pre-Education Assessment   Patient understands the diabetes disease and treatment process.  Needs Instruction    Patient understands incorporating nutritional management into lifestyle.  Needs Instruction    Patient undertands incorporating physical activity into lifestyle.  Needs Instruction    Patient understands using medications safely.  Needs Review    Patient understands monitoring blood glucose, interpreting and using results  Needs Review    Patient understands prevention, detection, and treatment of acute complications.  Needs Instruction    Patient understands prevention, detection, and treatment of chronic complications.  Needs Instruction    Patient understands how to develop strategies to address psychosocial issues.  Needs Instruction    Patient understands how to develop strategies to  promote health/change behavior.  Needs Instruction      Complications   Last HgB A1C per patient/outside source  14.7 % 11-20-17    How often do you check your blood sugar?  1-2 times/day    Fasting Blood glucose range (mg/dL)  161-096;04-540    Postprandial Blood glucose range (mg/dL)  98-119;147-829    Have you had a dilated eye exam in the past 12 months?  Yes 07-2017    Have you had a dental exam in the past 12 months?  No 03-2017    Are you checking your feet?  No      Dietary Intake   Breakfast  eats egg and  1 ezekial bread at 7:30a    Snack (morning)  eats fruit with peanut butter or nuts at 9a    Lunch  eats salads with grilled chicken from [employer] at 10-11:30a    Snack (afternoon)  eats fruit or crackers and peanut butter or nuts at 1p and 5p    Dinner  eats supper at 7p-9p    Snack (evening)  none    Beverage(s)  drinks water 8+x/day and 1-2 sugar free carbonated flavored waters/day      Exercise   Exercise Type  Light (walking / raking leaves)    How many days per week to you exercise?  2.5    How many minutes per day do you exercise?  30    Total minutes per week of exercise  75  Patient Education   Previous Diabetes Education  No    Disease state   Definition of diabetes, type 1 and 2, and the diagnosis of diabetes;Factors that contribute to the development of diabetes;Explored patient's options for treatment of their diabetes    Nutrition management   Role of diet in the treatment of diabetes and the relationship between the three main macronutrients and blood glucose level;Carbohydrate counting;Food label reading, portion sizes and measuring food.    Physical activity and exercise   Role of exercise on diabetes management, blood pressure control and cardiac health.;Helped patient identify appropriate exercises in relation to his/her diabetes, diabetes complications and other health issue.    Medications  Reviewed patients medication for diabetes, action, purpose,  timing of dose and side effects.    Monitoring  Purpose and frequency of SMBG.;Taught/discussed recording of test results and interpretation of SMBG.;Identified appropriate SMBG and/or A1C goals.;Yearly dilated eye exam    Acute complications  Taught treatment of hypoglycemia - the 15 rule.    Chronic complications  Relationship between chronic complications and blood glucose control;Nephropathy, what it is, prevention of, the use of ACE, ARB's and early detection of through urine microalbumia.;Reviewed with patient heart disease, higher risk of, and prevention;Retinopathy and reason for yearly dilated eye exams;Lipid levels, blood glucose control and heart disease;Dental care    Preconception care  Pregnancy and GDM  Role of pre-pregnancy blood glucose control on the development of the fetus;Role of family planning for patients with diabetes    Personal strategies to promote health  Lifestyle issues that need to be addressed for better diabetes care;Helped patient develop diabetes management plan for (enter comment);Review risk of smoking and offered smoking cessation pt currently vapes      Outcomes   Expected Outcomes  Demonstrated interest in learning. Expect positive outcomes       Individualized Plan for Diabetes Self-Management Training:   Learning Objective:  Patient will have a greater understanding of diabetes self-management. Patient education plan is to attend individual and/or group sessions per assessed needs and concerns.   Plan:   Patient Instructions  Check blood sugars 2 x day before breakfast and before supper every day and some 2 hr after meals and record  Bring blood sugar records to the next appointment/class  Exercise: walk for   30  minutes  3-4  days a week  Eat 3 meals day,   2-3  snacks a day  Space meals 4-5 hours apart  Avoid sugar sweetened drinks (soda, tea, coffee, sports drinks, juices)  Drink plenty of water  Limit intake of sweets/desserts and  fried foods  Quit vaping-consider smoking cessation classes  Make a  dentist  appointment  Get a Sharps container  Carry fast acting glucose and a snack at all times  Take Novolin 70/30 injection 30 min before breakfast and supper if able  Rotate injection sites  Return for appointment/classes on:  12-21-17   Expected Outcomes:  Demonstrated interest in learning. Expect positive outcomes  Education material provided: general meal planning guidelines, low BG handout, food group handout, quit smoking class handout, 1 tube glucose tablets for PRn use  If problems or questions, patient to contact team via:  [phone]  Future DSME appointment:  12-21-17

## 2017-12-14 NOTE — Patient Instructions (Addendum)
Check blood sugars 2 x day before breakfast and before supper every day and some 2 hr after meals and record  Bring blood sugar records to the next appointment/class  Exercise: walk for   30  minutes  3-4  days a week  Eat 3 meals day,   2-3  snacks a day  Space meals 4-5 hours apart  Avoid sugar sweetened drinks (soda, tea, coffee, sports drinks, juices)  Drink plenty of water  Limit intake of sweets/desserts and fried foods  Quit vaping  Make a  dentist  appointment  Get a Sharps container  Carry fast acting glucose and a snack at all times  Rotate injection sites  Take Novolin 70/30 injection 30 min. Before breakfast and supper  Return for appointment/classes on:  12-21-17

## 2017-12-21 ENCOUNTER — Encounter: Payer: Self-pay | Admitting: Dietician

## 2017-12-21 VITALS — Ht 62.0 in | Wt 225.1 lb

## 2017-12-21 DIAGNOSIS — E119 Type 2 diabetes mellitus without complications: Secondary | ICD-10-CM

## 2017-12-21 NOTE — Progress Notes (Signed)

## 2017-12-28 ENCOUNTER — Encounter: Payer: Self-pay | Admitting: Dietician

## 2017-12-28 NOTE — Progress Notes (Signed)
Pt called and is running late due to traffic and will have to leave class early tonight so pt decided to reschedule class 2 instead-rescheduled on 01-25-18

## 2018-01-11 ENCOUNTER — Ambulatory Visit: Payer: Self-pay

## 2018-01-12 ENCOUNTER — Telehealth: Payer: Self-pay | Admitting: Dietician

## 2018-01-12 NOTE — Telephone Encounter (Signed)
Called patient regarding class she missed on 01/11/18. Left a voicemail message for her to return as scheduled on 01/25/18 for class 2, and can then come for class 3 on 02/01/18.

## 2018-01-17 ENCOUNTER — Emergency Department
Admission: EM | Admit: 2018-01-17 | Discharge: 2018-01-17 | Disposition: A | Discharge status: Home / Self Care | Payer: Self-pay | Attending: Emergency Medicine | Admitting: Emergency Medicine

## 2018-01-17 ENCOUNTER — Encounter: Payer: Self-pay | Admitting: Emergency Medicine

## 2018-01-17 DIAGNOSIS — Y999 Unspecified external cause status: Secondary | ICD-10-CM | POA: Insufficient documentation

## 2018-01-17 DIAGNOSIS — Z23 Encounter for immunization: Secondary | ICD-10-CM | POA: Insufficient documentation

## 2018-01-17 DIAGNOSIS — S61259A Open bite of unspecified finger without damage to nail, initial encounter: Secondary | ICD-10-CM

## 2018-01-17 DIAGNOSIS — Z794 Long term (current) use of insulin: Secondary | ICD-10-CM | POA: Insufficient documentation

## 2018-01-17 DIAGNOSIS — W503XXA Accidental bite by another person, initial encounter: Secondary | ICD-10-CM | POA: Insufficient documentation

## 2018-01-17 DIAGNOSIS — E119 Type 2 diabetes mellitus without complications: Secondary | ICD-10-CM | POA: Insufficient documentation

## 2018-01-17 DIAGNOSIS — Y939 Activity, unspecified: Secondary | ICD-10-CM | POA: Insufficient documentation

## 2018-01-17 DIAGNOSIS — Y929 Unspecified place or not applicable: Secondary | ICD-10-CM | POA: Insufficient documentation

## 2018-01-17 DIAGNOSIS — Z87891 Personal history of nicotine dependence: Secondary | ICD-10-CM | POA: Insufficient documentation

## 2018-01-17 DIAGNOSIS — S61256A Open bite of right little finger without damage to nail, initial encounter: Secondary | ICD-10-CM | POA: Insufficient documentation

## 2018-01-17 DIAGNOSIS — S61254A Open bite of right ring finger without damage to nail, initial encounter: Secondary | ICD-10-CM | POA: Insufficient documentation

## 2018-01-17 MED ORDER — NAPROXEN 500 MG PO TABS: 500.0000 mg | ORAL_TABLET | Freq: Two times a day (BID) | ORAL | Status: DC

## 2018-01-17 MED ORDER — AMOXICILLIN-POT CLAVULANATE 875-125 MG PO TABS: 1.0000 | ORAL_TABLET | Freq: Two times a day (BID) | ORAL | 0 refills | Status: AC

## 2018-01-17 MED ORDER — TETANUS-DIPHTH-ACELL PERTUSSIS 5-2.5-18.5 LF-MCG/0.5 IM SUSP
0.5000 mL | Freq: Once | INTRAMUSCULAR | Status: AC
  Administered 2018-01-17: 0.5 mL via INTRAMUSCULAR
  Filled 2018-01-17: qty 0.5
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
## 2018-01-17 NOTE — ED Notes (Signed)
NAD noted at time of D/C. Pt denies questions or concerns. Pt ambulatory to the lobby at this time.  
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
## 2018-01-17 NOTE — Discharge Instructions (Signed)
Follow discharge care instruction take medication as directed.  Keep wounds covered with dry sterile dressings.

## 2018-01-25 ENCOUNTER — Encounter: Payer: Self-pay | Admitting: Dietician

## 2018-01-25 ENCOUNTER — Encounter: Payer: Self-pay | Attending: Student | Admitting: Dietician

## 2018-01-25 VITALS — Ht 62.0 in | Wt 232.7 lb

## 2018-01-25 DIAGNOSIS — Z713 Dietary counseling and surveillance: Secondary | ICD-10-CM | POA: Insufficient documentation

## 2018-01-25 DIAGNOSIS — E1165 Type 2 diabetes mellitus with hyperglycemia: Secondary | ICD-10-CM | POA: Insufficient documentation

## 2018-01-25 DIAGNOSIS — E119 Type 2 diabetes mellitus without complications: Secondary | ICD-10-CM

## 2018-01-25 NOTE — Progress Notes (Signed)

## 2018-02-01 ENCOUNTER — Encounter: Payer: Self-pay | Admitting: Dietician

## 2018-02-01 NOTE — Progress Notes (Signed)
Patient cancelled attendance at class 3 tonight due to illness. She does wish to reschedule.

## 2018-02-04 ENCOUNTER — Telehealth: Payer: Self-pay | Admitting: Dietician

## 2018-02-04 NOTE — Telephone Encounter (Signed)
Called patient and rescheduled class 3 for 03/01/18.

## 2018-03-01 ENCOUNTER — Encounter: Payer: Self-pay | Admitting: Dietician

## 2018-03-01 ENCOUNTER — Ambulatory Visit: Payer: Self-pay

## 2018-03-01 NOTE — Progress Notes (Signed)
Patient cancelled her attendance at class 3 today due to illness.

## 2018-03-09 ENCOUNTER — Encounter: Payer: Self-pay | Admitting: Dietician

## 2018-03-09 NOTE — Progress Notes (Signed)
Pt has been called and rescheduled class 3 on 03-29-18

## 2018-03-29 ENCOUNTER — Encounter: Payer: Self-pay | Admitting: Dietician

## 2018-03-29 ENCOUNTER — Ambulatory Visit: Payer: Self-pay

## 2018-09-28 DIAGNOSIS — Z794 Long term (current) use of insulin: Secondary | ICD-10-CM | POA: Diagnosis not present

## 2018-09-28 DIAGNOSIS — E1165 Type 2 diabetes mellitus with hyperglycemia: Secondary | ICD-10-CM | POA: Diagnosis not present

## 2018-09-28 DIAGNOSIS — Z0000 Encounter for general adult medical examination without abnormal findings: Secondary | ICD-10-CM | POA: Diagnosis not present

## 2018-11-09 DIAGNOSIS — Z794 Long term (current) use of insulin: Secondary | ICD-10-CM | POA: Insufficient documentation

## 2018-11-09 DIAGNOSIS — E1165 Type 2 diabetes mellitus with hyperglycemia: Secondary | ICD-10-CM | POA: Insufficient documentation

## 2018-12-07 DIAGNOSIS — E1165 Type 2 diabetes mellitus with hyperglycemia: Secondary | ICD-10-CM | POA: Diagnosis not present

## 2018-12-07 DIAGNOSIS — R112 Nausea with vomiting, unspecified: Secondary | ICD-10-CM | POA: Diagnosis not present

## 2018-12-07 DIAGNOSIS — R109 Unspecified abdominal pain: Secondary | ICD-10-CM | POA: Diagnosis not present

## 2019-02-21 ENCOUNTER — Emergency Department
Admission: EM | Admit: 2019-02-21 | Discharge: 2019-02-21 | Disposition: A | Discharge status: Home / Self Care | Payer: PRIVATE HEALTH INSURANCE | Attending: Emergency Medicine | Admitting: Emergency Medicine

## 2019-02-21 ENCOUNTER — Encounter: Payer: Self-pay | Admitting: Emergency Medicine

## 2019-02-21 ENCOUNTER — Emergency Department: Payer: PRIVATE HEALTH INSURANCE

## 2019-02-21 ENCOUNTER — Other Ambulatory Visit: Payer: Self-pay

## 2019-02-21 DIAGNOSIS — K2960 Other gastritis without bleeding: Secondary | ICD-10-CM

## 2019-02-21 DIAGNOSIS — Z794 Long term (current) use of insulin: Secondary | ICD-10-CM | POA: Insufficient documentation

## 2019-02-21 DIAGNOSIS — K2990 Gastroduodenitis, unspecified, without bleeding: Secondary | ICD-10-CM | POA: Insufficient documentation

## 2019-02-21 DIAGNOSIS — Z79899 Other long term (current) drug therapy: Secondary | ICD-10-CM | POA: Diagnosis not present

## 2019-02-21 DIAGNOSIS — E119 Type 2 diabetes mellitus without complications: Secondary | ICD-10-CM | POA: Diagnosis not present

## 2019-02-21 DIAGNOSIS — Z87891 Personal history of nicotine dependence: Secondary | ICD-10-CM | POA: Insufficient documentation

## 2019-02-21 DIAGNOSIS — R55 Syncope and collapse: Secondary | ICD-10-CM | POA: Diagnosis present

## 2019-02-21 DIAGNOSIS — R112 Nausea with vomiting, unspecified: Secondary | ICD-10-CM

## 2019-02-21 LAB — URINALYSIS, COMPLETE (UACMP) WITH MICROSCOPIC
Bilirubin Urine: NEGATIVE
Glucose, UA: NEGATIVE mg/dL
Hgb urine dipstick: NEGATIVE
Ketones, ur: NEGATIVE mg/dL
Nitrite: NEGATIVE
Protein, ur: NEGATIVE mg/dL
Specific Gravity, Urine: 1.023 (ref 1.005–1.030)
pH: 5 (ref 5.0–8.0)

## 2019-02-21 LAB — COMPREHENSIVE METABOLIC PANEL
ALT: 18 U/L (ref 0–44)
AST: 20 U/L (ref 15–41)
Albumin: 4.1 g/dL (ref 3.5–5.0)
Alkaline Phosphatase: 81 U/L (ref 38–126)
Anion gap: 9 (ref 5–15)
BUN: 12 mg/dL (ref 6–20)
CO2: 21 mmol/L — ABNORMAL LOW (ref 22–32)
Calcium: 9.1 mg/dL (ref 8.9–10.3)
Chloride: 106 mmol/L (ref 98–111)
Creatinine, Ser: 0.69 mg/dL (ref 0.44–1.00)
GFR calc Af Amer: >60 mL/min (ref >60)
GFR calc non Af Amer: >60 mL/min (ref >60)
Glucose, Bld: 112 mg/dL — ABNORMAL HIGH (ref 70–99)
Potassium: 3.7 mmol/L (ref 3.5–5.1)
Sodium: 136 mmol/L (ref 135–145)
Total Bilirubin: 0.5 mg/dL (ref 0.3–1.2)
Total Protein: 7.8 g/dL (ref 6.5–8.1)

## 2019-02-21 LAB — LIPASE, BLOOD: Lipase: 34 U/L (ref 11–51)

## 2019-02-21 LAB — CBC
HCT: 43.2 % (ref 36.0–46.0)
Hemoglobin: 14.4 g/dL (ref 12.0–15.0)
MCH: 28.7 pg (ref 26.0–34.0)
MCHC: 33.3 g/dL (ref 30.0–36.0)
MCV: 86.1 fL (ref 80.0–100.0)
Platelets: 340 10*3/uL (ref 150–400)
RBC: 5.02 MIL/uL (ref 3.87–5.11)
RDW: 13.6 % (ref 11.5–15.5)
WBC: 12.6 10*3/uL — ABNORMAL HIGH (ref 4.0–10.5)
nRBC: 0 % (ref 0.0–0.2)

## 2019-02-21 LAB — POCT PREGNANCY, URINE: Preg Test, Ur: NEGATIVE

## 2019-02-21 MED ORDER — PANTOPRAZOLE SODIUM 20 MG PO TBEC: 20.0000 mg | DELAYED_RELEASE_TABLET | Freq: Every day | ORAL | 0 refills | Status: DC

## 2019-02-21 MED ORDER — ONDANSETRON 4 MG PO TBDP
4.0000 mg | ORAL_TABLET | Freq: Once | ORAL | Status: AC
  Administered 2019-02-21: 4 mg via ORAL
  Filled 2019-02-21: qty 1

## 2019-02-21 MED ORDER — SODIUM CHLORIDE 0.9% FLUSH: 3.0000 mL | Freq: Once | INTRAVENOUS | Status: DC

## 2019-02-21 NOTE — ED Notes (Signed)
See triage note  Presents s/p syncopal episode   States she "passed out" at work this am    States she has been having some generalized stomach pain for several days  Positive n/v  States she thought is was her metformin so she has not taken it in over a weeks

## 2019-02-21 NOTE — ED Triage Notes (Signed)
C/O abdominal cramping and nausea this morning at work.  Vomited twice and then woke up on the floor.  Patient states she has been having abdominal / GI issues.  Currently AAOx3.  C/O continued abdominal pain and nausea.

## 2019-02-21 NOTE — ED Provider Notes (Signed)
[facility] Emergency Department Provider Note  ____________________________________________   First MD Initiated Contact with Patient 02/21/19 1306     (approximate)  I have reviewed the triage vital signs and the nursing notes.   HISTORY  Chief Complaint No chief complaint on file.   HPI Allison Austin is a 23 y.o. female presents to the ED today after having a syncopal episode at work.  Patient states that she has had generalized abdominal pain for several weeks and has had a couple of video checks with her doctor without any improvement of her symptoms.  Patient states that she has generalized abdominal pain along with some nausea which she thought was coming from her metformin.  She discontinued taking the metformin which has not improved her symptoms.  She complains of cramping sensation this morning which caused her to vomit twice.  This was mostly bile and then she "woke up on the floor".  She states that her abdominal issues have been going on for greater than a month.  She states that she has noticed it is worse with eating fried foods and she experiences a lot of gas and reflux.  She rates her pain as 6 out of 10.     Past Medical History:  Diagnosis Date  . ADHD (attention deficit hyperactivity disorder)   . Adopted   . Anxiety   . Back pain   . Depression   . Diabetes mellitus without complication ([place])   . Frequent headaches   . GERD (gastroesophageal reflux disease)    OCC- NO MEDS  . Pre-diabetes     There are no active problems to display for this patient.   Past Surgical History:  Procedure Laterality Date  . GANGLION CYST EXCISION    . PILONIDAL CYST EXCISION N/A 04/17/2016   Procedure: CYST EXCISION PILONIDAL EXTENSIVE;  Surgeon: Leonie Green, MD;  Location: [facility] ORS;  Service: General;  Laterality: N/A;  . TONSILLECTOMY    . WISDOM TOOTH EXTRACTION      Prior to Admission medications   Medication Sig Start Date End  Date Taking? Authorizing Provider  etonogestrel (NEXPLANON) 68 MG IMPL implant 1 each by Subdermal route once.     [provider]  insulin NPH-regular Human (HUMULIN 70/30) (70-30) 100 UNIT/ML injection Inject 5 Units into the skin 2 (two) times daily before a meal. 11/20/17 11/20/18  [provider]  Multiple Vitamin (MULTI-VITAMINS) TABS Take by mouth.    [provider]  pantoprazole (PROTONIX) 20 MG tablet Take 1 tablet (20 mg total) by mouth daily. 02/21/19 02/21/20  Johnn Hai, PA-C  triamcinolone cream (KENALOG) 0.1 % Apply 1 application topically as needed.  05/13/17   [provider]    Allergies Patient has no known allergies.  Family History  Adopted: Yes  Problem Relation Age of Onset  . Testicular cancer Maternal Uncle   . Diabetes Maternal Grandmother   . Uterine cancer Maternal Grandmother     Social History Social History   Tobacco Use  . Smoking status: Former Research scientist (life sciences)  . Smokeless tobacco: Never Used  . Tobacco comment: vape 1x every other day  Substance Use Topics  . Alcohol use: No    Alcohol/week: 0.0 standard drinks  . Drug use: No    Review of Systems Constitutional: No fever/chills Cardiovascular: Denies chest pain. Respiratory: Denies shortness of breath. Gastrointestinal: Positive abdominal pain.  Positive nausea, positive vomiting.  No diarrhea.  No constipation. Genitourinary: Negative for dysuria. Musculoskeletal:  Negative for muscle aches Skin: Negative for rash. Neurological: Negative for headaches, focal weakness or numbness. ____________________________________________   PHYSICAL EXAM:  VITAL SIGNS: ED Triage Vitals  Enc Vitals Group     BP 02/21/19 1100 126/74     Pulse Rate 02/21/19 1100 74     Resp 02/21/19 1100 16     Temp 02/21/19 1100 98.6 F (37 C)     Temp Source 02/21/19 1100 Oral     SpO2 02/21/19 1100 97 %     Weight 02/21/19 1058 250 lb (113.4 kg)     Height 02/21/19 1058 5\' 2"   (1.575 m)     Head Circumference --      Peak Flow --      Pain Score 02/21/19 1058 6     Pain Loc --      Pain Edu? --      Excl. in GC? --    Constitutional: Alert and oriented. Well appearing and in no acute distress. Eyes: Conjunctivae are normal.  Head: Atraumatic. Neck: No stridor.   Cardiovascular: Normal rate, regular rhythm. Grossly normal heart sounds.  Good peripheral circulation. Respiratory: Normal respiratory effort.  No retractions. Lungs CTAB. Gastrointestinal: Soft, obese.  Bowel sounds are normoactive x4 quadrants.  There is some diffuse tenderness in the right upper quadrant.  No point tenderness is noted on exam. Musculoskeletal: Moves upper and lower extremities without any difficulty.  Normal gait was noted. Neurologic:  Normal speech and language. No gross focal neurologic deficits are appreciated.  Skin:  Skin is warm, dry and intact. No rash noted. Psychiatric: Mood and affect are normal. Speech and behavior are normal.  ____________________________________________   LABS (all labs ordered are listed, but only abnormal results are displayed)  Labs Reviewed  COMPREHENSIVE METABOLIC PANEL - Abnormal; Notable for the following components:      Result Value   CO2 21 (*)    Glucose, Bld 112 (*)    All other components within normal limits  CBC - Abnormal; Notable for the following components:   WBC 12.6 (*)    All other components within normal limits  URINALYSIS, COMPLETE (UACMP) WITH MICROSCOPIC - Abnormal; Notable for the following components:   Color, Urine YELLOW (*)    APPearance CLOUDY (*)    Leukocytes,Ua SMALL (*)    Bacteria, UA RARE (*)    All other components within normal limits  LIPASE, BLOOD  POC URINE PREG, ED  POCT PREGNANCY, URINE   ____________________________________________  EKG  Reviewed by Dr. Fanny BienQuale NSR with ventricular rate 75, PR interval 134, QRS duration 80 ____________________________________________  RADIOLOGY   Official radiology report(s): [place] Abdomen Limited Ruq  Result Date: 02/21/2019 CLINICAL DATA:  Intermittent nausea vomiting. EXAM: ULTRASOUND ABDOMEN LIMITED RIGHT UPPER QUADRANT COMPARISON:  None. FINDINGS: Gallbladder: No gallstones or wall thickening visualized. No sonographic Murphy sign noted by sonographer. Common bile duct: Diameter: 2.5 mm Liver: No focal lesion identified. Within normal limits in parenchymal echogenicity. Portal vein is patent on color Doppler imaging with normal direction of blood flow towards the liver. IMPRESSION: Normal right upper quadrant ultrasound. Electronically Signed   By: Amie Portlandavid  Ormond M.D.   On: 02/21/2019 14:25    ____________________________________________   PROCEDURES  Procedure(s) performed (including Critical Care):  Procedures   ____________________________________________   INITIAL IMPRESSION / ASSESSMENT AND PLAN / ED COURSE  As part of my medical decision making, I reviewed the following data within the electronic MEDICAL RECORD NUMBER Notes from prior ED visits and  [place] Controlled Substance Database  23 year old female presents to the ED with approximately 2 months history of abdominal discomfort with gas and reflux symptoms.  She initially thought that this was coming from her metformin but has discontinued it and still has symptoms.  She is been in touch with her PCP but is not had any test.  Today she felt worse and vomited twice and states that she "woke up on the floor".  Patient states that the abdominal pain is worse with eating certain foods such as fried foods or anything spicy.  Lab work was unremarkable.  Ultrasound did not show any gallstones or gallbladder disease.  Patient was made aware.  She was placed on Protonix 20 mg daily to take until she is able to see her PCP.  She is also instructed to discontinue eating fried or spicy foods and also dairy products.  ____________________________________________   FINAL CLINICAL  IMPRESSION(S) / ED DIAGNOSES  Final diagnoses:  Reflux gastritis     ED Discharge Orders         Ordered    pantoprazole (PROTONIX) 20 MG tablet  Daily     02/21/19 1442           Note:  This document was prepared using Dragon voice recognition software and may include unintentional dictation errors.    Tommi RumpsSummers, Rhonda L, PA-C 02/21/19 1458    Sharyn CreamerQuale, Mark, MD 02/21/19 2122

## 2019-02-21 NOTE — Discharge Instructions (Signed)
Follow-up with your primary care provider if any continued problems.  Begin taking Protonix 20 mg once daily every day until you have been seen by your provider.  They may schedule you an appointment with a gastroenterologist to be evaluated if not improving.  Your ultrasound today was negative for gallstones or gallbladder disease.  At this time avoid fried foods, spicy foods and dairy products.  Return to the emergency department if any severe worsening of your symptoms.

## 2019-02-23 ENCOUNTER — Other Ambulatory Visit
Admission: RE | Admit: 2019-02-23 | Discharge: 2019-02-23 | Disposition: A | Discharge status: Home / Self Care | Payer: 59 | Source: Ambulatory Visit | Attending: Gastroenterology | Admitting: Gastroenterology

## 2019-02-23 DIAGNOSIS — R197 Diarrhea, unspecified: Secondary | ICD-10-CM | POA: Diagnosis present

## 2019-02-23 LAB — GASTROINTESTINAL PANEL BY PCR, STOOL (REPLACES STOOL CULTURE)

## 2019-02-23 LAB — C DIFFICILE QUICK SCREEN W PCR REFLEX
C Diff antigen: NEGATIVE
C Diff interpretation: NOT DETECTED
C Diff toxin: NEGATIVE

## 2019-03-01 LAB — PANCREATIC ELASTASE, FECAL: Pancreatic Elastase-1, Stool: >500 ug Elast./g (ref >200)

## 2019-03-01 LAB — CALPROTECTIN, FECAL: Calprotectin, Fecal: <16 ug/g (ref 0–120)

## 2019-03-12 HISTORY — PX: COLONOSCOPY: SHX5424

## 2019-03-22 ENCOUNTER — Ambulatory Visit: Payer: PRIVATE HEALTH INSURANCE | Admitting: Gastroenterology

## 2019-06-28 ENCOUNTER — Other Ambulatory Visit: Payer: Self-pay | Admitting: Gastroenterology

## 2019-06-28 DIAGNOSIS — R112 Nausea with vomiting, unspecified: Secondary | ICD-10-CM

## 2019-07-01 ENCOUNTER — Other Ambulatory Visit: Payer: Self-pay

## 2019-07-01 ENCOUNTER — Ambulatory Visit
Admission: RE | Admit: 2019-07-01 | Discharge: 2019-07-01 | Disposition: A | Discharge status: Home / Self Care | Payer: 59 | Source: Ambulatory Visit | Attending: Gastroenterology | Admitting: Gastroenterology

## 2019-07-01 DIAGNOSIS — R112 Nausea with vomiting, unspecified: Secondary | ICD-10-CM | POA: Diagnosis present

## 2019-07-01 MED ORDER — TECHNETIUM TC 99M SULFUR COLLOID
2.0000 | Freq: Once | INTRAVENOUS | Status: AC | PRN
  Administered 2019-07-01: 2.87 via ORAL

## 2019-10-13 ENCOUNTER — Telehealth: Payer: Self-pay

## 2019-10-13 NOTE — Telephone Encounter (Signed)
Pt calling: is having weird sxs; has nexplanon; has started spotting; having bad cramps that are nauseating; started bleeding this morning; bleeding is heavier than usual. Did not have any of this with her previous nexplanon.   Also, is it time for a pap smear?  [phone] Pt aware CRS is on call today.  Msg will be forwarded to her - just cannot promise when she will hear from her.  Pt tx'd to SP for sched of annual/pap.  Nexplanon due to come out in Dec this year.

## 2019-10-14 NOTE — Telephone Encounter (Signed)
Patient reports that since 2015 she has not had her period since that time. Cramps for 5 days, bleeding for 3 days.  Recommended taking motrin 600 mg every 6 hours for 7 days. Discussed symptoms of concerning heavy bleeding and asked her to let us know if period last for more than 7-10 days. Will bee seen soon for an annual visit.

## 2019-11-01 ENCOUNTER — Other Ambulatory Visit (HOSPITAL_COMMUNITY)
Admission: RE | Admit: 2019-11-01 | Discharge: 2019-11-01 | Disposition: A | Discharge status: Home / Self Care | Payer: 59 | Source: Ambulatory Visit | Attending: Obstetrics and Gynecology | Admitting: Obstetrics and Gynecology

## 2019-11-01 ENCOUNTER — Ambulatory Visit (INDEPENDENT_AMBULATORY_CARE_PROVIDER_SITE_OTHER): Payer: 59 | Admitting: Obstetrics and Gynecology

## 2019-11-01 ENCOUNTER — Encounter: Payer: Self-pay | Admitting: Obstetrics and Gynecology

## 2019-11-01 ENCOUNTER — Other Ambulatory Visit: Payer: Self-pay

## 2019-11-01 VITALS — BP 110/60 | Ht 62.0 in | Wt 258.0 lb

## 2019-11-01 DIAGNOSIS — N938 Other specified abnormal uterine and vaginal bleeding: Secondary | ICD-10-CM | POA: Diagnosis not present

## 2019-11-01 DIAGNOSIS — Z1329 Encounter for screening for other suspected endocrine disorder: Secondary | ICD-10-CM

## 2019-11-01 DIAGNOSIS — Z13220 Encounter for screening for lipoid disorders: Secondary | ICD-10-CM

## 2019-11-01 DIAGNOSIS — N939 Abnormal uterine and vaginal bleeding, unspecified: Secondary | ICD-10-CM

## 2019-11-01 DIAGNOSIS — Z01411 Encounter for gynecological examination (general) (routine) with abnormal findings: Secondary | ICD-10-CM | POA: Diagnosis not present

## 2019-11-01 DIAGNOSIS — E119 Type 2 diabetes mellitus without complications: Secondary | ICD-10-CM | POA: Diagnosis not present

## 2019-11-01 DIAGNOSIS — Z124 Encounter for screening for malignant neoplasm of cervix: Secondary | ICD-10-CM

## 2019-11-01 DIAGNOSIS — Z0000 Encounter for general adult medical examination without abnormal findings: Secondary | ICD-10-CM

## 2019-11-01 NOTE — Progress Notes (Signed)
Gynecology Annual Exam  PCP: Wayland Denis, PA-C  Chief Complaint:  Chief Complaint  Patient presents with  . Gynecologic Exam    very heavy bleeding for 2 weeks and still having some slight spotting, needs pap     History of Present Illness: Patient is a 24 y.o. G0P0000 presents for annual exam. The patient has no complaints today.   LMP: No LMP recorded. Patient has had an implant. Allison Austin reports that after she had her Nexplanon placed in 2018 she had small amounts of spotting but no vaginal bleeding until recently.  At the beginning of March she had 12 to 14 days of heavy vaginal bleeding.  She was not saturating a pad an hour but she was passing Clementine size clots.  She had a few episodes of flooding sensations where she felt like she had urinated, but it was blood.   She had painful menstrual cramps during this time.  Since then she has seen a small amount of pink bleeding.  Her bleeding has otherwise resolved.  She is sexually active with one partner.  She denies any pain with intercourse.  She uses a Nexplanon for birth control.  She does not have a history of sexually transmitted infections however she reports that her partner has a history of chlamydia.  She has no history of fibroids polyps or ovarian cyst.  She does not have a known history of polycystic ovarian syndrome.  The patient does perform self breast exams.  There is no notable family history of breast or ovarian cancer in her family.   The patient reports that she walks for about 30 minutes every other day.    The patient denies current symptoms of depression.  She reports that she has struggled with depression in the past which she feels some of her depression anxiety currently is situational.  Related to concerns regarding expenses. PHQ-9 score is 8 GAD-7 score is 8  She reports she was taking Metformin and insulin in the past however she had upset stomach and GI issues with these  medications.  The symptoms were enough for her to have a colonoscopy which was normal.  She does check her sugars and reports that her fasting are generally 96-99.  She will also check them before dinner the can range between 70-125.  She has been using holistic methods to care for her diabetes by watching what she eats.  Review of Systems: Review of Systems  Constitutional: Negative for chills, fever, malaise/fatigue and weight loss.  HENT: Negative for congestion, hearing loss and sinus pain.   Eyes: Negative for blurred vision and double vision.  Respiratory: Negative for cough, sputum production, shortness of breath and wheezing.   Cardiovascular: Negative for chest pain, palpitations, orthopnea and leg swelling.  Gastrointestinal: Positive for diarrhea, nausea and vomiting. Negative for abdominal pain and constipation.  Genitourinary: Negative for dysuria, flank pain, frequency, hematuria and urgency.  Musculoskeletal: Negative for back pain, falls and joint pain.  Skin: Negative for itching and rash.  Neurological: Negative for dizziness and headaches.  Psychiatric/Behavioral: Negative for depression, substance abuse and suicidal ideas. The patient is nervous/anxious.     Past Medical History:  There are no problems to display for this patient.   Past Surgical History:  Past Surgical History:  Procedure Laterality Date  . COLONOSCOPY  03/2019  . GANGLION CYST EXCISION    . PILONIDAL CYST EXCISION N/A 04/17/2016   Procedure: CYST EXCISION PILONIDAL EXTENSIVE;  Surgeon: Hillery Aldo  Katrinka Blazing, MD;  Location: [facility] ORS;  Service: General;  Laterality: N/A;  . TONSILLECTOMY    . WISDOM TOOTH EXTRACTION      Gynecologic History:  No LMP recorded. Patient has had an implant. Contraception: Nexplanon Last Pap: Results were: never   Obstetric History: G0P0000  Family History:  Family History  Adopted: Yes  Problem Relation Age of Onset  . Testicular cancer Maternal Uncle   .  Diabetes Maternal Grandmother   . Uterine cancer Maternal Grandmother     Social History:  Social History   Socioeconomic History  . Marital status: Single    Spouse name: Not on file  . Number of children: Not on file  . Years of education: Not on file  . Highest education level: Not on file  Occupational History  . Not on file  Tobacco Use  . Smoking status: Former Games developer  . Smokeless tobacco: Never Used  . Tobacco comment: vape 1x every other day  Substance and Sexual Activity  . Alcohol use: No    Alcohol/week: 0.0 standard drinks  . Drug use: No  . Sexual activity: Yes    Birth control/protection: Implant  Other Topics Concern  . Not on file  Social History Narrative  . Not on file   Social Determinants of Health   Financial Resource Strain:   . Difficulty of Paying Living Expenses:   Food Insecurity:   . Worried About Programme researcher, broadcasting/film/video in the Last Year:   . Barista in the Last Year:   Transportation Needs:   . Freight forwarder (Medical):   Marland Kitchen Lack of Transportation (Non-Medical):   Physical Activity:   . Days of Exercise per Week:   . Minutes of Exercise per Session:   Stress:   . Feeling of Stress :   Social Connections:   . Frequency of Communication with Friends and Family:   . Frequency of Social Gatherings with Friends and Family:   . Attends Religious Services:   . Active Member of Clubs or Organizations:   . Attends Banker Meetings:   Marland Kitchen Marital Status:   Intimate Partner Violence:   . Fear of Current or Ex-Partner:   . Emotionally Abused:   Marland Kitchen Physically Abused:   . Sexually Abused:     Allergies:  No Known Allergies  Medications: Prior to Admission medications   Medication Sig Start Date End Date Taking? Authorizing Provider  etonogestrel (NEXPLANON) 68 MG IMPL implant 1 each by Subdermal route once.    Yes [provider]    Physical Exam Vitals: Blood pressure 110/60, height 5\' 2"  (1.575 m),  weight 258 lb (117 kg).  General: NAD HEENT: normocephalic, anicteric Thyroid: no enlargement, no palpable nodules Pulmonary: No increased work of breathing, CTAB Cardiovascular: RRR, distal pulses 2+ Breast: Breast symmetrical, no tenderness, no palpable nodules or masses, no skin or nipple retraction present, no nipple discharge.  No axillary or supraclavicular lymphadenopathy. Abdomen: NABS, soft, non-tender, non-distended.  Umbilicus without lesions.  No hepatomegaly, splenomegaly or masses palpable. No evidence of hernia  Genitourinary:  External: Normal external female genitalia.  Normal urethral meatus, normal Bartholin's and Skene's glands.    Vagina: Normal vaginal mucosa, no evidence of prolapse.    Cervix: Grossly normal in appearance, no bleeding  Uterus: Non-enlarged, mobile, normal contour.  No CMT  Adnexa: ovaries non-enlarged, no adnexal masses  Rectal: deferred  Lymphatic: no evidence of inguinal lymphadenopathy Extremities: no edema, erythema, or tenderness Neurologic:  Grossly intact Psychiatric: mood appropriate, affect full  Female chaperone present for pelvic and breast  portions of the physical exam    Assessment: 24 y.o. G0P0000 routine annual exam  Plan: Problem List Items Addressed This Visit    None    Visit Diagnoses    Health maintenance examination    -  Primary   Cervical cancer screening       Relevant Orders   Cytology - PAP      1) 4) Gardasil Series discussed and if applicable offered to patient - Patient has previously completed 3 shot series   2) STI screening  was offered and accepted  3)  ASCCP guidelines and rational discussed.  Patient opts for every 3 years screening interval  4) Contraception - the patient is currently using  Nexplanon.  She is happy with her current form of contraception and plans to continue We discussed safe sex practices to reduce her furture risk of STI's.    5) Abnormal uterine bleeding.  Could be related  to Nexplanon birth control.  We will have patient follow-up for pelvic ultrasound.  6) We will have patient return for fasting labs.  She has not seen her PCP due to the Covid pandemic and is due for a hemoglobin A1c check.  No prior lipid panel seen and will order that as well.  7) Return in about 1 year (around 10/31/2020) for annual.   Adelene Idler MD [facility], [facility] 11/01/2019 2:34 PM

## 2019-11-01 NOTE — Patient Instructions (Addendum)
[employer] Recommended Dietary Allowances for Calcium and Vitamin D  Age (yr) Calcium Recommended Dietary Allowance (mg/day) Vitamin D Recommended Dietary Allowance (international units/day)  9-18 1,300 600  19-50 1,000 600  51-70 1,200 600  71 and older 1,200 800  Data from [employer]. Dietary reference intakes: calcium, vitamin D. [place], [place]: National Academies Press; 2011.      Exercising to Stay Healthy To become healthy and stay healthy, it is recommended that you do moderate-intensity and vigorous-intensity exercise. You can tell that you are exercising at a moderate intensity if your heart starts beating faster and you start breathing faster but can still hold a conversation. You can tell that you are exercising at a vigorous intensity if you are breathing much harder and faster and cannot hold a conversation while exercising. Exercising regularly is important. It has many health benefits, such as:  Improving overall fitness, flexibility, and endurance.  Increasing bone density.  Helping with weight control.  Decreasing body fat.  Increasing muscle strength.  Reducing stress and tension.  Improving overall health. How often should I exercise? Choose an activity that you enjoy, and set realistic goals. Your health care provider can help you make an activity plan that works for you. Exercise regularly as told by your health care provider. This may include:  Doing strength training two times a week, such as: ? Lifting weights. ? Using resistance bands. ? Push-ups. ? Sit-ups. ? Yoga.  Doing a certain intensity of exercise for a given amount of time. Choose from these options: ? A total of 150 minutes of moderate-intensity exercise every week. ? A total of 75 minutes of vigorous-intensity exercise every week. ? A mix of moderate-intensity and vigorous-intensity exercise every week. Children, pregnant women, people who have not  exercised regularly, people who are overweight, and older adults may need to talk with a health care provider about what activities are safe to do. If you have a medical condition, be sure to talk with your health care provider before you start a new exercise program. What are some exercise ideas? Moderate-intensity exercise ideas include:  Walking 1 mile (1.6 km) in about 15 minutes.  Biking.  Hiking.  Golfing.  Dancing.  Water aerobics. Vigorous-intensity exercise ideas include:  Walking 4.5 miles (7.2 km) or more in about 1 hour.  Jogging or running 5 miles (8 km) in about 1 hour.  Biking 10 miles (16.1 km) or more in about 1 hour.  Lap swimming.  Roller-skating or in-line skating.  Cross-country skiing.  Vigorous competitive sports, such as football, basketball, and soccer.  Jumping rope.  Aerobic dancing. What are some everyday activities that can help me to get exercise?  Yard work, such as: ? Pushing a lawn mower. ? Raking and bagging leaves.  Washing your car.  Pushing a stroller.  Shoveling snow.  Gardening.  Washing windows or floors. How can I be more active in my day-to-day activities?  Use stairs instead of an elevator.  Take a walk during your lunch break.  If you drive, park your car farther away from your work or school.  If you take public transportation, get off one stop early and walk the rest of the way.  Stand up or walk around during all of your indoor phone calls.  Get up, stretch, and walk around every 30 minutes throughout the day.  Enjoy exercise with a friend. Support to continue exercising will help you keep a regular routine of activity. What guidelines   can I follow while exercising?  Before you start a new exercise program, talk with your health care provider.  Do not exercise so much that you hurt yourself, feel dizzy, or get very short of breath.  Wear comfortable clothes and wear shoes with good support.  Drink  plenty of water while you exercise to prevent dehydration or heat stroke.  Work out until your breathing and your heartbeat get faster. Where to find more information  [place] Department of Health and Human Services: www.hhs.gov  Centers for Disease Control and Prevention (CDC): www.cdc.gov Summary  Exercising regularly is important. It will improve your overall fitness, flexibility, and endurance.  Regular exercise also will improve your overall health. It can help you control your weight, reduce stress, and improve your bone density.  Do not exercise so much that you hurt yourself, feel dizzy, or get very short of breath.  Before you start a new exercise program, talk with your health care provider. This information is not intended to replace advice given to you by your health care provider. Make sure you discuss any questions you have with your health care provider. Document Revised: 07/10/2017 Document Reviewed: 06/18/2017 Elsevier Patient Education  2020 Else[employer] Inc.  Budget-Friendly Healthy Eating There are many ways to save money at the grocery store and continue to eat healthy. You can be successful if you:  Plan meals according to your budget.  Make a grocery list and only purchase food according to your grocery list.  Prepare food yourself. What are tips for following this plan?  Reading food labels  Compare food labels between brand name foods and the store brand. Often the nutritional value is the same, but the store brand is lower cost.  Look for products that do not have added sugar, fat, or salt (sodium). These often cost the same but are healthier for you. Products may be labeled as: ? Sugar-free. ? Nonfat. ? Low-fat. ? Sodium-free. ? Low-sodium.  Look for lean ground beef labeled as at least 92% lean and 8% fat. Shopping  Buy only the items on your grocery list and go only to the areas of the store that have the items on your list.  Use coupons only for  foods and brands you normally buy. Avoid buying items you wouldn't normally buy simply because they are on sale.  Check online and in newspapers for weekly deals.  Buy healthy items from the bulk bins when available, such as herbs, spices, flour, pasta, nuts, and dried fruit.  Buy fruits and vegetables that are in season. Prices are usually lower on in-season produce.  Look at the unit price on the price tag. Use it to compare different brands and sizes to find out which item is the best deal.  Choose healthy items that are often low-cost, such as carrots, potatoes, apples, bananas, and oranges. Dried or canned beans are a low-cost protein source.  Buy in bulk and freeze extra food. Items you can buy in bulk include meats, fish, poultry, frozen fruits, and frozen vegetables.  Avoid buying "ready-to-eat" foods, such as pre-cut fruits and vegetables and pre-made salads.  If possible, shop around to discover where you can find the best prices. Consider other retailers such as dollar stores, larger wholesale stores, local fruit and vegetable stands, and farmers markets.  Do not shop when you are hungry. If you shop while hungry, it may be hard to stick to your list and budget.  Resist impulse buying. Use your grocery list as   your official plan for the week.  Buy a variety of vegetables and fruits by purchasing fresh, frozen, and canned items.  Look at the top and bottom shelves for deals. Foods at eye level (eye level of an adult or child) are usually more expensive.  Be efficient with your time when shopping. The more time you spend at the store, the more money you are likely to spend.  To save money when choosing more expensive foods like meats and dairy: ? Choose cheaper cuts of meat, such as bone-in chicken thighs and drumsticks instead of skinless and boneless chicken. When you are ready to prepare the chicken, you can remove the skin yourself to make it healthier. ? Choose lean meats  like chicken or [place] instead of beef. ? Choose canned seafood, such as tuna, salmon, or sardines. ? Buy eggs as a low-cost source of protein. ? Buy dried beans and peas, such as lentils, split peas, or kidney beans instead of meats. Dried beans and peas are a good alternative source of protein. ? Buy the larger tubs of yogurt instead of individual-sized containers.  Choose water instead of sodas and other sweetened beverages.  Avoid buying chips, cookies, and other "junk food." These items are usually expensive and not healthy. Cooking  Make extra food and freeze the extras in meal-sized containers or in individual portions for fast meals and snacks.  Pre-cook on days when you have extra time to prepare meals in advance. You can keep these meals in the fridge or freezer and reheat for a quick meal.  When you come home from the grocery store, wash, peel, and cut fruits and vegetables so they are ready to use and eat. This will help reduce food waste. Meal planning  Do not eat out or get fast food. Prepare food at home.  Make a grocery list and make sure to bring it with you to the store. If you have a smart phone, you could use your phone to create your shopping list.  Plan meals and snacks according to a grocery list and budget you create.  Use leftovers in your meal plan for the week.  Look for recipes where you can cook once and make enough food for two meals.  Include budget-friendly meals like stews, casseroles, and stir-fry dishes.  Try some meatless meals or try "no cook" meals like salads.  Make sure that half your plate is filled with fruits or vegetables. Choose from fresh, frozen, or canned fruits and vegetables. If eating canned, remember to rinse them before eating. This will remove any excess salt added for packaging. Summary  Eating healthy on a budget is possible if you plan your meals according to your budget, purchase according to your budget and grocery list,  and prepare food yourself.  Tips for buying more food on a limited budget include buying generic brands, using coupons only for foods you normally buy, and buying healthy items from the bulk bins when available.  Tips for buying cheaper food to replace expensive food include choosing cheaper, lean cuts of meat, and buying dried beans and peas. This information is not intended to replace advice given to you by your health care provider. Make sure you discuss any questions you have with your health care provider. Document Revised: 07/29/2017 Document Reviewed: 07/29/2017 Elsevier Patient Education  2020 Else[employer] Inc.    Living With Depression Everyone experiences occasional disappointment, sadness, and loss in their lives. When you are feeling down, blue, or sad for  at least 2 weeks in a row, it may mean that you have depression. Depression can affect your thoughts and feelings, relationships, daily activities, and physical health. It is caused by changes in the way your brain functions. If you receive a diagnosis of depression, your health care provider will tell you which type of depression you have and what treatment options are available to you. If you are living with depression, there are ways to help you recover from it and also ways to prevent it from coming back. How to cope with lifestyle changes Coping with stress     Stress is your body's reaction to life changes and events, both good and bad. Stressful situations may include:  Getting married.  The death of a spouse.  Losing a job.  Retiring.  Having a baby. Stress can last just a few hours or it can be ongoing. Stress can play a major role in depression, so it is important to learn both how to cope with stress and how to think about it differently. Talk with your health care provider or a counselor if you would like to learn more about stress reduction. He or she may suggest some stress reduction techniques, such  as:  Music therapy. This can include creating music or listening to music. Choose music that you enjoy and that inspires you.  Mindfulness-based meditation. This kind of meditation can be done while sitting or walking. It involves being aware of your normal breaths, rather than trying to control your breathing.  Centering prayer. This is a kind of meditation that involves focusing on a spiritual word or phrase. Choose a word, phrase, or sacred image that is meaningful to you and that brings you peace.  Deep breathing. To do this, expand your stomach and inhale slowly through your nose. Hold your breath for 3-5 seconds, then exhale slowly, allowing your stomach muscles to relax.  Muscle relaxation. This involves intentionally tensing muscles then relaxing them. Choose a stress reduction technique that fits your lifestyle and personality. Stress reduction techniques take time and practice to develop. Set aside 5-15 minutes a day to do them. Therapists can offer training in these techniques. The training may be covered by some insurance plans. Other things you can do to manage stress include:  Keeping a stress diary. This can help you learn what triggers your stress and ways to control your response.  Understanding what your limits are and saying no to requests or events that lead to a schedule that is too full.  Thinking about how you respond to certain situations. You may not be able to control everything, but you can control how you react.  Adding humor to your life by watching funny films or TV shows.  Making time for activities that help you relax and not feeling guilty about spending your time this way.  Medicines Your health care provider may suggest certain medicines if he or she feels that they will help improve your condition. Avoid using alcohol and other substances that may prevent your medicines from working properly (may interact). It is also important to:  Talk with your  pharmacist or health care provider about all the medicines that you take, their possible side effects, and what medicines are safe to take together.  Make it your goal to take part in all treatment decisions (shared decision-making). This includes giving input on the side effects of medicines. It is best if shared decision-making with your health care provider is part of your total  treatment plan. If your health care provider prescribes a medicine, you may not notice the full benefits of it for 4-8 weeks. Most people who are treated for depression need to be on medicine for at least 6-12 months after they feel better. If you are taking medicines as part of your treatment, do not stop taking medicines without first talking to your health care provider. You may need to have the medicine slowly decreased (tapered) over time to decrease the risk of harmful side effects. Relationships Your health care provider may suggest family therapy along with individual therapy and drug therapy. While there may not be family problems that are causing you to feel depressed, it is still important to make sure your family learns as much as they can about your mental health. Having your family's support can help make your treatment successful. How to recognize changes in your condition Everyone has a different response to treatment for depression. Recovery from major depression happens when you have not had signs of major depression for two months. This may mean that you will start to:  Have more interest in doing activities.  Feel less hopeless than you did 2 months ago.  Have more energy.  Overeat less often, or have better or improving appetite.  Have better concentration. Your health care provider will work with you to decide the next steps in your recovery. It is also important to recognize when your condition is getting worse. Watch for these signs:  Having fatigue or low energy.  Eating too much or too  little.  Sleeping too much or too little.  Feeling restless, agitated, or hopeless.  Having trouble concentrating or making decisions.  Having unexplained physical complaints.  Feeling irritable, angry, or aggressive. Get help as soon as you or your family members notice these symptoms coming back. How to get support and help from others How to talk with friends and family members about your condition  Talking to friends and family members about your condition can provide you with one way to get support and guidance. Reach out to trusted friends or family members, explain your symptoms to them, and let them know that you are working with a health care provider to treat your depression. Financial resources Not all insurance plans cover mental health care, so it is important to check with your insurance carrier. If paying for co-pays or counseling services is a problem, search for a local or county mental health care center. They may be able to offer public mental health care services at low or no cost when you are not able to see a private health care provider. If you are taking medicine for depression, you may be able to get the generic form, which may be less expensive. Some makers of prescription medicines also offer help to patients who cannot afford the medicines they need. Follow these instructions at home:   Get the right amount and quality of sleep.  Cut down on using caffeine, tobacco, alcohol, and other potentially harmful substances.  Try to exercise, such as walking or lifting small weights.  Take over-the-counter and prescription medicines only as told by your health care provider.  Eat a healthy diet that includes plenty of vegetables, fruits, whole grains, low-fat dairy products, and lean protein. Do not eat a lot of foods that are high in solid fats, added sugars, or salt.  Keep all follow-up visits as told by your health care provider. This is important. Contact a  health care provider if:  You stop taking your antidepressant medicines, and you have any of these symptoms: ? Nausea. ? Headache. ? Feeling lightheaded. ? Chills and body aches. ? Not being able to sleep (insomnia).  You or your friends and family think your depression is getting worse. Get help right away if:  You have thoughts of hurting yourself or others. If you ever feel like you may hurt yourself or others, or have thoughts about taking your own life, get help right away. You can go to your nearest emergency department or call:  Your local emergency services (911 in the [place]  A suicide crisis helpline, such as the National Suicide Prevention Lifeline at 845-252-6151. This is open 24-hours a day. Summary  If you are living with depression, there are ways to help you recover from it and also ways to prevent it from coming back.  Work with your health care team to create a management plan that includes counseling, stress management techniques, and healthy lifestyle habits. This information is not intended to replace advice given to you by your health care provider. Make sure you discuss any questions you have with your health care provider. Document Revised: 11/19/2018 Document Reviewed: 06/30/2016 Elsevier Patient Education  2020 [employer] Inc.  Managing Anxiety, Adult After being diagnosed with an anxiety disorder, you may be relieved to know why you have felt or behaved a certain way. You may also feel overwhelmed about the treatment ahead and what it will mean for your life. With care and support, you can manage this condition and recover from it. How to manage lifestyle changes Managing stress and anxiety  Stress is your body's reaction to life changes and events, both good and bad. Most stress will last just a few hours, but stress can be ongoing and can lead to more than just stress. Although stress can play a major role in anxiety, it is not the same as anxiety. Stress  is usually caused by something external, such as a deadline, test, or competition. Stress normally passes after the triggering event has ended.  Anxiety is caused by something internal, such as imagining a terrible outcome or worrying that something will go wrong that will devastate you. Anxiety often does not go away even after the triggering event is over, and it can become long-term (chronic) worry. It is important to understand the differences between stress and anxiety and to manage your stress effectively so that it does not lead to an anxious response. Talk with your health care provider or a counselor to learn more about reducing anxiety and stress. He or she may suggest tension reduction techniques, such as:  Music therapy. This can include creating or listening to music that you enjoy and that inspires you.  Mindfulness-based meditation. This involves being aware of your normal breaths while not trying to control your breathing. It can be done while sitting or walking.  Centering prayer. This involves focusing on a word, phrase, or sacred image that means something to you and brings you peace.  Deep breathing. To do this, expand your stomach and inhale slowly through your nose. Hold your breath for 3-5 seconds. Then exhale slowly, letting your stomach muscles relax.  Self-talk. This involves identifying thought patterns that lead to anxiety reactions and changing those patterns.  Muscle relaxation. This involves tensing muscles and then relaxing them. Choose a tension reduction technique that suits your lifestyle and personality. These techniques take time and practice. Set aside 5-15 minutes a day to do them. Therapists can  offer counseling and training in these techniques. The training to help with anxiety may be covered by some insurance plans. Other things you can do to manage stress and anxiety include:  Keeping a stress/anxiety diary. This can help you learn what triggers your  reaction and then learn ways to manage your response.  Thinking about how you react to certain situations. You may not be able to control everything, but you can control your response.  Making time for activities that help you relax and not feeling guilty about spending your time in this way.  Visual imagery and yoga can help you stay calm and relax.  Medicines Medicines can help ease symptoms. Medicines for anxiety include:  Anti-anxiety drugs.  Antidepressants. Medicines are often used as a primary treatment for anxiety disorder. Medicines will be prescribed by a health care provider. When used together, medicines, psychotherapy, and tension reduction techniques may be the most effective treatment. Relationships Relationships can play a big part in helping you recover. Try to spend more time connecting with trusted friends and family members. Consider going to couples counseling, taking family education classes, or going to family therapy. Therapy can help you and others better understand your condition. How to recognize changes in your anxiety Everyone responds differently to treatment for anxiety. Recovery from anxiety happens when symptoms decrease and stop interfering with your daily activities at home or work. This may mean that you will start to:  Have better concentration and focus. Worry will interfere less in your daily thinking.  Sleep better.  Be less irritable.  Have more energy.  Have improved memory. It is important to recognize when your condition is getting worse. Contact your health care provider if your symptoms interfere with home or work and you feel like your condition is not improving. Follow these instructions at home: Activity  Exercise. Most adults should do the following: ? Exercise for at least 150 minutes each week. The exercise should increase your heart rate and make you sweat (moderate-intensity exercise). ? Strengthening exercises at least twice a  week.  Get the right amount and quality of sleep. Most adults need 7-9 hours of sleep each night. Lifestyle   Eat a healthy diet that includes plenty of vegetables, fruits, whole grains, low-fat dairy products, and lean protein. Do not eat a lot of foods that are high in solid fats, added sugars, or salt.  Make choices that simplify your life.  Do not use any products that contain nicotine or tobacco, such as cigarettes, e-cigarettes, and chewing tobacco. If you need help quitting, ask your health care provider.  Avoid caffeine, alcohol, and certain over-the-counter cold medicines. These may make you feel worse. Ask your pharmacist which medicines to avoid. General instructions  Take over-the-counter and prescription medicines only as told by your health care provider.  Keep all follow-up visits as told by your health care provider. This is important. Where to find support You can get help and support from these sources:  Self-help groups.  Online and [employer].  A trusted spiritual leader.  Couples counseling.  Family education classes.  Family therapy. Where to find more information You may find that joining a support group helps you deal with your anxiety. The following sources can help you locate counselors or support groups near you:  Mental Health America: www.mentalhealthamerica.net  Anxiety and Depression Association of [place] (ADAA): ProgramCam.de  The [employer] on Mental Illness (NAMI): www.nami.org Contact a health care provider if you:  Have a hard time  staying focused or finishing daily tasks.  Spend many hours a day feeling worried about everyday life.  Become exhausted by worry.  Start to have headaches, feel tense, or have nausea.  Urinate more than normal.  Have diarrhea. Get help right away if you have:  A racing heart and shortness of breath.  Thoughts of hurting yourself or others. If you ever feel like you may hurt  yourself or others, or have thoughts about taking your own life, get help right away. You can go to your nearest emergency department or call:  Your local emergency services (911 in the [place]  A suicide crisis helpline, such as the National Suicide Prevention Lifeline at (516)695-4107. This is open 24 hours a day. Summary  Taking steps to learn and use tension reduction techniques can help calm you and help prevent triggering an anxiety reaction.  When used together, medicines, psychotherapy, and tension reduction techniques may be the most effective treatment.  Family, friends, and partners can play a big part in helping you recover from an anxiety disorder. This information is not intended to replace advice given to you by your health care provider. Make sure you discuss any questions you have with your health care provider. Document Revised: 12/28/2018 Document Reviewed: 12/28/2018 Elsevier Patient Education  2020 [employer].    [facility] [place] [place] Same Day Access Hours:  Monday, Wednesday and Friday, 8am - 3pm Walk-In Crisis Hours: 7 days/wk, 8am - 8pm [address] [place], [place] [address] [phone]  [facility] [phone]  Mobile Crisis Unit [phone]   Therapists/Counselors/Psychologists   Karen [place], [place]  & Jacqlyn Krauss [place]    [phone]        [address]       [place], [place] [address]        Ival Bible, CSW [phone] [address] [place], [place] [address]  Harle Battiest, [place]        [phone]        [address]      [place], [place] [address]        Chyrel Masson, [place] [phone] [address] [place], [place] [address]   Oscar La, LMFT       [phone]        [address]       [place], [place] [address]        Felecia Jan [phone] [address] [place], [place] [address]  Lester Highwood        [phone]        [address]       [place], [place] [address]        Kerin Salen [phone] [address] [place], [place] [address]  Tyron Russell, PsyD       [phone]        [address]       [place], [place] [address]        Elita Quick, LPC [phone] [address]  [place], [place] [address]   Debarah Crape        [phone]        [address] [place], [place] [address]         Rosana Hoes [facility] [phone] [email or web address]   Sation Konchella       [phone]        [address]  [place], [place] [address]        [place] [phone] [email or web address]

## 2019-11-14 ENCOUNTER — Other Ambulatory Visit: Payer: 59

## 2019-11-16 ENCOUNTER — Ambulatory Visit: Payer: 59 | Admitting: Obstetrics and Gynecology

## 2019-11-16 ENCOUNTER — Other Ambulatory Visit: Payer: 59

## 2019-11-24 ENCOUNTER — Other Ambulatory Visit: Payer: 59

## 2019-11-24 ENCOUNTER — Other Ambulatory Visit: Payer: Self-pay

## 2019-11-24 DIAGNOSIS — Z1329 Encounter for screening for other suspected endocrine disorder: Secondary | ICD-10-CM

## 2019-11-24 DIAGNOSIS — Z13220 Encounter for screening for lipoid disorders: Secondary | ICD-10-CM

## 2019-11-24 DIAGNOSIS — Z0000 Encounter for general adult medical examination without abnormal findings: Secondary | ICD-10-CM

## 2019-11-24 DIAGNOSIS — E119 Type 2 diabetes mellitus without complications: Secondary | ICD-10-CM

## 2019-11-25 LAB — LIPID PANEL
Chol/HDL Ratio: 6 ratio — ABNORMAL HIGH (ref 0.0–4.4)
Cholesterol, Total: 179 mg/dL (ref 100–199)
HDL: 30 mg/dL — ABNORMAL LOW (ref >39)
LDL Chol Calc (NIH): 128 mg/dL — ABNORMAL HIGH (ref 0–99)
Triglycerides: 114 mg/dL (ref 0–149)
VLDL Cholesterol Cal: 21 mg/dL (ref 5–40)

## 2019-11-25 LAB — CBC
Hematocrit: 43.6 % (ref 34.0–46.6)
Hemoglobin: 14.4 g/dL (ref 11.1–15.9)
MCH: 28.5 pg (ref 26.6–33.0)
MCHC: 33 g/dL (ref 31.5–35.7)
MCV: 86 fL (ref 79–97)
Platelets: 333 10*3/uL (ref 150–450)
RBC: 5.06 x10E6/uL (ref 3.77–5.28)
RDW: 13.7 % (ref 11.7–15.4)
WBC: 7.7 10*3/uL (ref 3.4–10.8)

## 2019-11-25 LAB — TSH+FREE T4
Free T4: 1.11 ng/dL (ref 0.82–1.77)
TSH: 0.839 u[IU]/mL (ref 0.450–4.500)

## 2019-11-25 LAB — HEMOGLOBIN A1C
Est. average glucose Bld gHb Est-mCnc: 128 mg/dL
Hgb A1c MFr Bld: 6.1 % — ABNORMAL HIGH (ref 4.8–5.6)

## 2019-11-28 ENCOUNTER — Other Ambulatory Visit: Payer: Self-pay

## 2019-11-28 ENCOUNTER — Encounter: Payer: Self-pay | Admitting: Obstetrics and Gynecology

## 2019-11-28 ENCOUNTER — Ambulatory Visit (INDEPENDENT_AMBULATORY_CARE_PROVIDER_SITE_OTHER): Payer: 59

## 2019-11-28 ENCOUNTER — Ambulatory Visit (INDEPENDENT_AMBULATORY_CARE_PROVIDER_SITE_OTHER): Payer: 59 | Admitting: Obstetrics and Gynecology

## 2019-11-28 VITALS — BP 124/80 | Ht 62.0 in | Wt 258.0 lb

## 2019-11-28 DIAGNOSIS — Z794 Long term (current) use of insulin: Secondary | ICD-10-CM

## 2019-11-28 DIAGNOSIS — E1165 Type 2 diabetes mellitus with hyperglycemia: Secondary | ICD-10-CM

## 2019-11-28 DIAGNOSIS — N939 Abnormal uterine and vaginal bleeding, unspecified: Secondary | ICD-10-CM | POA: Diagnosis not present

## 2019-11-28 DIAGNOSIS — Z713 Dietary counseling and surveillance: Secondary | ICD-10-CM

## 2019-11-28 NOTE — Progress Notes (Signed)
   Patient ID: Allison Austin, female   DOB: [birth year], 24 y.o.   MRN: [id number]  Reason for Consult: Follow-up   Referred by Carren Rang, PA-C  Subjective:     HPI:  Allison Austin is a 24 y.o. female. She is following up for a pelvic US today secondary to abnormal uterine bleeding with nexplanon.    Past Medical History:  Diagnosis Date  . ADHD (attention deficit hyperactivity disorder)   . Adopted   . Anxiety   . Back pain   . Depression   . Diabetes mellitus without complication (HCC) 2019  . Frequent headaches   . GERD (gastroesophageal reflux disease)    OCC- NO MEDS  . Pre-diabetes    Family History  Adopted: Yes  Problem Relation Age of Onset  . Testicular cancer Maternal Uncle   . Diabetes Maternal Grandmother   . Uterine cancer Maternal Grandmother    Past Surgical History:  Procedure Laterality Date  . COLONOSCOPY  03/2019  . GANGLION CYST EXCISION    . PILONIDAL CYST EXCISION N/A 04/17/2016   Procedure: CYST EXCISION PILONIDAL EXTENSIVE;  Surgeon: Nadeen Landau, MD;  Location: [facility] ORS;  Service: General;  Laterality: N/A;  . TONSILLECTOMY    . WISDOM TOOTH EXTRACTION      Short Social History:  Social History   Tobacco Use  . Smoking status: Former Games developer  . Smokeless tobacco: Never Used  . Tobacco comment: vape 1x every other day  Substance Use Topics  . Alcohol use: No    Alcohol/week: 0.0 standard drinks    No Known Allergies  Current Outpatient Medications  Medication Sig Dispense Refill  . etonogestrel (NEXPLANON) 68 MG IMPL implant 1 each by Subdermal route once.      No current facility-administered medications for this visit.    REVIEW OF SYSTEMS      Objective:  Objective   Vitals:   11/28/19 1422  BP: 124/80  Weight: 258 lb (117 kg)  Height: 5\' 2"  (1.575 m)   Body mass index is 47.19 kg/m.  Physical Exam      Assessment/Plan:    24 yo Reviewed the normal 21 results.   [place]  MD [facility], [facility] 11/28/2019 3:16 PM

## 2019-11-28 NOTE — Patient Instructions (Signed)
Lipid Profile Test Why am I having this test? The lipid profile test can be used to help evaluate your risk for developing heart disease. The test is also used to monitor your levels during treatment for high cholesterol to see if you are reaching your goals. What is being tested? A lipid profile measures the following:  Total cholesterol. Cholesterol is a waxy, fat-like substance in your blood. If your total cholesterol level is high, this can increase your risk for heart disease.  High-density lipoprotein (HDL). This is known as the good cholesterol. Having high levels of HDL decreases your risk for heart disease. Your HDL level may be low if you smoke or do not get enough exercise.  Low-density lipoprotein (LDL). This is known as the bad cholesterol. This type causes plaque to build up in your arteries. Having a low level of LDL is best. Having high levels of LDL increases your risk for heart disease.  Cholesterol to HDL ratio. This is calculated by dividing your total cholesterol by your HDL cholesterol. The ratio is used by health care providers to determine your risk for heart disease. A low ratio is best.  Triglycerides. These are fats that your body can store or burn for energy. Low levels are best. Having high levels of triglycerides increases your risk for heart disease. What kind of sample is taken?  A blood sample is required for this test. It is usually collected by inserting a needle into a blood vessel. How do I prepare for this test? Do not drink alcohol starting at least 24 hours before your test. Follow any instructions from your health care provider about dietary restrictions before your test. Do not eat or drink anything other than water after midnight on the night before the test, or as told by your health care provider. Tell a health care provider about:  All medicines you are taking, including vitamins, herbs, eye drops, creams, and over-the-counter medicines.  Any  medical conditions you have.  Whether you are pregnant or may be pregnant. How are the results reported? Your test results will be reported as values that indicate your cholesterol and triglyceride levels. Your health care provider will compare your results to normal ranges that were established after testing a large group of people (reference ranges). Reference ranges may vary among labs and hospitals. For this test, common reference ranges are: Total cholesterol  Adult or elderly: less than 200 mg/dL.  Child: 120-200 mg/dL.  Infant: 70-175 mg/dL.  Newborn: 53-135 mg/dL. HDL  Female: greater than 45 mg/dL.  Female: greater than 55 mg/dL. HDL reference values based on your risk for heart disease:  Low risk for heart disease: ? Female: 60 mg/dL. ? Female: 70 mg/dL.  Moderate risk for heart disease: ? Female: 45 mg/dL. ? Female: 55 mg/dL.  High risk for heart disease: ? Female: 25 mg/dL. ? Female: 35 mg/dL. LDL  Adults: Your health care provider will determine a target level for LDL based on your risk for heart disease. ? If you are at low risk, your LDL should be 130 mg/dL or less. ? If you are at moderate risk, your LDL should be 100 mg/dL or less. ? If you are at high risk, your LDL should be 70 mg/dL or less.  Children: less than 110 mg/dL. Cholesterol to HDL ratio Reference values based on your risk for heart disease:  Risk that is half the average risk: ? Female: 3.4. ? Female: 3.3.  Average risk: ? Female: 5.0. ?   Female: 4.4.  Risk that is two times average (moderate risk): ? Female: 10.0. ? Female: 7.0.  Risk that is three times average (high risk): ? Female: 24.0. ? Female: 11.0. Triglycerides  Adult or elderly: ? Female: 40-160 mg/dL. ? Female: 35-135 mg/dL.  Children 16-19 years old: ? Female: 40-163 mg/dL. ? Female: 40-128 mg/dL.  Children 12-15 years old: ? Female: 36-138 mg/dL. ? Female: 41-138 mg/dL.  Children 6-11 years old: ? Female: 31-108  mg/dL. ? Female: 35-114 mg/dL.  Children 0-5 years old: ? Female: 30-86 mg/dL. ? Female: 32-99 mg/dL. Triglycerides should be less than 400 mg/dL even when you are not fasting. What do the results mean? Results that are within the reference ranges are considered normal. Total cholesterol, LDL, and triglyceride levels that are higher than the reference ranges can mean that you have an increased risk for heart disease. An HDL level that is lower than the reference range can also indicate an increased risk. Talk with your health care provider about what your results mean. Questions to ask your health care provider Ask your health care provider, or the department that is doing the test:  When will my results be ready?  How will I get my results?  What are my treatment options?  What other tests do I need?  What are my next steps? Summary  The lipid profile test can be used to help predict the likelihood that you will develop heart disease. It can also help monitor your cholesterol levels during treatment.  A lipid profile measures your total cholesterol, high-density lipoprotein (HDL), low-density lipoprotein (LDL), cholesterol to HDL ratio, and triglycerides.  Total cholesterol, LDL, and triglyceride levels that are higher than the reference ranges can indicate an increased risk for heart disease.  An HDL level that is lower than the reference range can indicate an increased risk for heart disease.  Talk with your health care provider about what your results mean. This information is not intended to replace advice given to you by your health care provider. Make sure you discuss any questions you have with your health care provider. Document Revised: 05/04/2017 Document Reviewed: 05/04/2017 Elsevier Patient Education  2020 Elsevier Inc.  

## 2019-12-24 ENCOUNTER — Ambulatory Visit: Payer: PRIVATE HEALTH INSURANCE | Attending: Internal Medicine

## 2019-12-24 DIAGNOSIS — Z23 Encounter for immunization: Secondary | ICD-10-CM

## 2019-12-24 NOTE — Progress Notes (Signed)
   Covid-19 Vaccination Clinic  Name:  Ticia Virgo    MRN: [id number] DOB: 10-25-1995  12/24/2019  Ms. Wachob was observed post Covid-19 immunization for 15 minutes without incident. She was provided with Vaccine Information Sheet and instruction to access the V-Safe system.   Ms. Dotter was instructed to call 911 with any severe reactions post vaccine: Marland Kitchen Difficulty breathing  . Swelling of face and throat  . A fast heartbeat  . A bad rash all over body  . Dizziness and weakness   Immunizations Administered    Name Date Dose VIS Date Route   Pfizer COVID-19 Vaccine 12/24/2019 11:28 AM 0.3 mL 10/05/2018 Intramuscular   Manufacturer: [employer], [employer]   Lot: [id number]   NDC: [address]-1

## 2020-01-14 ENCOUNTER — Ambulatory Visit: Payer: PRIVATE HEALTH INSURANCE | Attending: Internal Medicine

## 2020-01-14 DIAGNOSIS — Z23 Encounter for immunization: Secondary | ICD-10-CM

## 2020-01-14 NOTE — Progress Notes (Signed)
   Covid-19 Vaccination Clinic  Name:  Allison Austin    MRN: [id number] DOB: 1996-05-03  01/14/2020  Allison Austin was observed post Covid-19 immunization for 15 minutes without incident. She was provided with Vaccine Information Sheet and instruction to access the V-Safe system.   Allison Austin was instructed to call 911 with any severe reactions post vaccine: Marland Kitchen Difficulty breathing  . Swelling of face and throat  . A fast heartbeat  . A bad rash all over body  . Dizziness and weakness   Immunizations Administered    Name Date Dose VIS Date Route   Pfizer COVID-19 Vaccine 01/14/2020  8:10 AM 0.3 mL 10/05/2018 Intramuscular   Manufacturer: [employer], [employer]   Lot: [id number]   NDC: [address]-7

## 2020-08-13 ENCOUNTER — Telehealth: Payer: Self-pay | Admitting: Obstetrics and Gynecology

## 2020-08-13 NOTE — Telephone Encounter (Signed)
Patient coming in on 08/21/2020 at 2:10 for nexplanon insertion with CRS

## 2020-08-21 ENCOUNTER — Ambulatory Visit: Payer: 59 | Admitting: Obstetrics and Gynecology

## 2020-08-31 NOTE — Telephone Encounter (Signed)
Per Epic, pt r/s to 09/07/20. Noted. Nexplanon reserved for this patient.

## 2020-09-07 ENCOUNTER — Other Ambulatory Visit: Payer: Self-pay

## 2020-09-07 ENCOUNTER — Encounter: Payer: Self-pay | Admitting: Obstetrics and Gynecology

## 2020-09-07 ENCOUNTER — Ambulatory Visit (INDEPENDENT_AMBULATORY_CARE_PROVIDER_SITE_OTHER): Payer: 59 | Admitting: Obstetrics and Gynecology

## 2020-09-07 VITALS — BP 130/72 | Ht 62.0 in | Wt 256.8 lb

## 2020-09-07 DIAGNOSIS — Z3046 Encounter for surveillance of implantable subdermal contraceptive: Secondary | ICD-10-CM

## 2020-09-07 DIAGNOSIS — Z30017 Encounter for initial prescription of implantable subdermal contraceptive: Secondary | ICD-10-CM

## 2020-09-07 NOTE — Progress Notes (Signed)
Nexplanon Removal and Insertion Procedure Note Removal: Appropriate time out taken. Patient placed in dorsal supine with left arm above head, elbow flexed at 90 degrees, arm resting on examination table.  The Nexplanon was noted in the patient's arm and the end was identified. The skin was cleansed with a Betadine solution. A small injection of subcutaneous lidocaine with epinephrine was given over the end of the implant. An incision was made at the end of the implant. The rod was noted in the incision and grasped with a hemostat. It was noted to be intact.    Insertion:The bicipital grove was palpated and site 8-10cm proximal to the medial epicondyle and 3-5 cm below the grove was identified.  Nexplanon removed from sterile blister packaging,  Device confirmed in needle, before inserting full length of needle, tenting up the skin as the needle was advance.  The drug eluting rod was then deployed by pulling back the slider per the manufactures recommendation.  The implant was palpable by the clinician as well as the patient.  The insertion site covered dressed with a band aid before applying  a kerlex bandage pressure dressing. Minimal blood loss was noted during the procedure.  The patient tolerated the procedure well.    Adelene Idler MD, [facility], [facility] 09/07/2020 3:08 PM

## 2020-09-07 NOTE — Patient Instructions (Signed)
Etonogestrel implant What is this medicine? ETONOGESTREL (et oh noe JES trel) is a contraceptive (birth control) device. It is used to prevent pregnancy. It can be used for up to 3 years. This medicine may be used for other purposes; ask your health care provider or pharmacist if you have questions. COMMON BRAND NAME(S): Implanon, Nexplanon What should I tell my health care provider before I take this medicine? They need to know if you have any of these conditions:  abnormal vaginal bleeding  blood vessel disease or blood clots  breast, cervical, endometrial, ovarian, liver, or uterine cancer  diabetes  gallbladder disease  heart disease or recent heart attack  high blood pressure  high cholesterol or triglycerides  kidney disease  liver disease  migraine headaches  seizures  stroke  tobacco smoker  an unusual or allergic reaction to etonogestrel, anesthetics or antiseptics, other medicines, foods, dyes, or preservatives  pregnant or trying to get pregnant  breast-feeding How should I use this medicine? This device is inserted just under the skin on the inner side of your upper arm by a health care professional. Talk to your pediatrician regarding the use of this medicine in children. Special care may be needed. Overdosage: If you think you have taken too much of this medicine contact a poison control center or emergency room at once. NOTE: This medicine is only for you. Do not share this medicine with others. What if I miss a dose? This does not apply. What may interact with this medicine? Do not take this medicine with any of the following medications:  amprenavir  fosamprenavir This medicine may also interact with the following medications:  acitretin  aprepitant  armodafinil  bexarotene  bosentan  carbamazepine  certain medicines for fungal infections like fluconazole, ketoconazole, itraconazole and voriconazole  certain medicines to treat  hepatitis, HIV or AIDS  cyclosporine  felbamate  griseofulvin  lamotrigine  modafinil  oxcarbazepine  phenobarbital  phenytoin  primidone  rifabutin  rifampin  rifapentine  St. John's wort  topiramate This list may not describe all possible interactions. Give your health care provider a list of all the medicines, herbs, non-prescription drugs, or dietary supplements you use. Also tell them if you smoke, drink alcohol, or use illegal drugs. Some items may interact with your medicine. What should I watch for while using this medicine? This product does not protect you against HIV infection (AIDS) or other sexually transmitted diseases. You should be able to feel the implant by pressing your fingertips over the skin where it was inserted. Contact your doctor if you cannot feel the implant, and use a non-hormonal birth control method (such as condoms) until your doctor confirms that the implant is in place. Contact your doctor if you think that the implant may have broken or become bent while in your arm. You will receive a user card from your health care provider after the implant is inserted. The card is a record of the location of the implant in your upper arm and when it should be removed. Keep this card with your health records. What side effects may I notice from receiving this medicine? Side effects that you should report to your doctor or health care professional as soon as possible:  allergic reactions like skin rash, itching or hives, swelling of the face, lips, or tongue  breast lumps, breast tissue changes, or discharge  breathing problems  changes in emotions or moods  coughing up blood  if you feel that the implant   may have broken or bent while in your arm  high blood pressure  pain, irritation, swelling, or bruising at the insertion site  scar at site of insertion  signs of infection at the insertion site such as fever, and skin redness, pain or  discharge  signs and symptoms of a blood clot such as breathing problems; changes in vision; chest pain; severe, sudden headache; pain, swelling, warmth in the leg; trouble speaking; sudden numbness or weakness of the face, arm or leg  signs and symptoms of liver injury like dark yellow or brown urine; general ill feeling or flu-like symptoms; light-colored stools; loss of appetite; nausea; right upper belly pain; unusually weak or tired; yellowing of the eyes or skin  unusual vaginal bleeding, discharge Side effects that usually do not require medical attention (report to your doctor or health care professional if they continue or are bothersome):  acne  breast pain or tenderness  headache  irregular menstrual bleeding  nausea This list may not describe all possible side effects. Call your doctor for medical advice about side effects. You may report side effects to FDA at 1-800-FDA-1088. Where should I keep my medicine? This drug is given in a hospital or clinic and will not be stored at home. NOTE: This sheet is a summary. It may not cover all possible information. If you have questions about this medicine, talk to your doctor, pharmacist, or health care provider.  2021 Elsevier/Gold Standard (2019-05-10 11:33:04)  

## 2020-09-07 NOTE — Progress Notes (Signed)
Nexplanon removal and reinsert.

## 2020-11-02 ENCOUNTER — Encounter: Payer: Self-pay | Admitting: Obstetrics and Gynecology

## 2020-11-02 ENCOUNTER — Other Ambulatory Visit: Payer: Self-pay

## 2020-11-02 ENCOUNTER — Other Ambulatory Visit (HOSPITAL_COMMUNITY)
Admission: RE | Admit: 2020-11-02 | Discharge: 2020-11-02 | Disposition: A | Discharge status: Home / Self Care | Payer: 59 | Source: Ambulatory Visit | Attending: Obstetrics and Gynecology | Admitting: Obstetrics and Gynecology

## 2020-11-02 ENCOUNTER — Ambulatory Visit (INDEPENDENT_AMBULATORY_CARE_PROVIDER_SITE_OTHER): Payer: 59 | Admitting: Obstetrics and Gynecology

## 2020-11-02 VITALS — BP 124/72 | Ht 62.0 in | Wt 249.2 lb

## 2020-11-02 DIAGNOSIS — Z124 Encounter for screening for malignant neoplasm of cervix: Secondary | ICD-10-CM | POA: Insufficient documentation

## 2020-11-02 DIAGNOSIS — Z6841 Body Mass Index (BMI) 40.0 and over, adult: Secondary | ICD-10-CM | POA: Diagnosis not present

## 2020-11-02 DIAGNOSIS — Z794 Long term (current) use of insulin: Secondary | ICD-10-CM

## 2020-11-02 DIAGNOSIS — E1165 Type 2 diabetes mellitus with hyperglycemia: Secondary | ICD-10-CM | POA: Diagnosis not present

## 2020-11-02 NOTE — Patient Instructions (Addendum)
[facility] of [place] for Calcium and Vitamin D  Age (yr) Calcium Recommended Dietary Allowance (mg/day) Vitamin D Recommended Dietary Allowance (international units/day)  9-18 1,300 600  19-50 1,000 600  51-70 1,200 600  71 and older 1,200 800  Data from Institute of Medicine. Dietary reference intakes: calcium, vitamin D. [place], [place]: [employer]; 2011.    Exercising to Stay Healthy To become healthy and stay healthy, it is recommended that you do moderate-intensity and vigorous-intensity exercise. You can tell that you are exercising at a moderate intensity if your heart starts beating faster and you start breathing faster but can still hold a conversation. You can tell that you are exercising at a vigorous intensity if you are breathing much harder and faster and cannot hold a conversation while exercising. Exercising regularly is important. It has many health benefits, such as:  Improving overall fitness, flexibility, and endurance.  Increasing bone density.  Helping with weight control.  Decreasing body fat.  Increasing muscle strength.  Reducing stress and tension.  Improving overall health. How often should I exercise? Choose an activity that you enjoy, and set realistic goals. Your health care provider can help you make an activity plan that works for you. Exercise regularly as told by your health care provider. This may include:  Doing strength training two times a week, such as: ? Lifting weights. ? Using resistance bands. ? Push-ups. ? Sit-ups. ? Yoga.  Doing a certain intensity of exercise for a given amount of time. Choose from these options: ? A total of 150 minutes of moderate-intensity exercise every week. ? A total of 75 minutes of vigorous-intensity exercise every week. ? A mix of moderate-intensity and vigorous-intensity exercise every week. Children, pregnant women, people who have not exercised  regularly, people who are overweight, and older adults may need to talk with a health care provider about what activities are safe to do. If you have a medical condition, be sure to talk with your health care provider before you start a new exercise program. What are some exercise ideas? Moderate-intensity exercise ideas include:  Walking 1 mile (1.6 km) in about 15 minutes.  Biking.  Hiking.  Golfing.  Dancing.  Water aerobics. Vigorous-intensity exercise ideas include:  Walking 4.5 miles (7.2 km) or more in about 1 hour.  Jogging or running 5 miles (8 km) in about 1 hour.  Biking 10 miles (16.1 km) or more in about 1 hour.  Lap swimming.  Roller-skating or in-line skating.  Cross-country skiing.  Vigorous competitive sports, such as football, basketball, and soccer.  Jumping rope.  Aerobic dancing.   What are some everyday activities that can help me to get exercise?  [place] work, such as: ? Pushing a Conservation officer, nature. ? Raking and bagging leaves.  Washing your car.  Pushing a stroller.  Shoveling snow.  Gardening.  Washing windows or floors. How can I be more active in my day-to-day activities?  Use stairs instead of an elevator.  Take a walk during your lunch break.  If you drive, park your car farther away from your work or school.  If you take public transportation, get off one stop early and walk the rest of the way.  Stand up or walk around during all of your indoor phone calls.  Get up, stretch, and walk around every 30 minutes throughout the day.  Enjoy exercise with a friend. Support to continue exercising will help you keep a regular routine of activity. What guidelines  can I follow while exercising?  Before you start a new exercise program, talk with your health care provider.  Do not exercise so much that you hurt yourself, feel dizzy, or get very short of breath.  Wear comfortable clothes and wear shoes with good support.  Drink plenty of  water while you exercise to prevent dehydration or heat stroke.  Work out until your breathing and your heartbeat get faster. Where to find more information  U.S. Department of Health and Human Services: www.hhs.gov  Centers for Disease Control and Prevention (CDC): www.cdc.gov Summary  Exercising regularly is important. It will improve your overall fitness, flexibility, and endurance.  Regular exercise also will improve your overall health. It can help you control your weight, reduce stress, and improve your bone density.  Do not exercise so much that you hurt yourself, feel dizzy, or get very short of breath.  Before you start a new exercise program, talk with your health care provider. This information is not intended to replace advice given to you by your health care provider. Make sure you discuss any questions you have with your health care provider. Document Revised: 07/10/2017 Document Reviewed: 06/18/2017 Elsevier Patient Education  2021 Elsevier Inc.   Budget-Friendly Healthy Eating There are many ways to save money at the grocery store and continue to eat healthy. You can be successful if you:  Plan meals according to your budget.  Make a grocery list and only purchase food according to your grocery list.  Prepare food yourself at home. What are tips for following this plan? Reading food labels  Compare food labels between brand name foods and the store brand. Often the nutritional value is the same, but the store brand is lower cost.  Look for products that do not have added sugar, fat, or salt (sodium). These often cost the same but are healthier for you. Products may be labeled as: ? Sugar-free. ? Nonfat. ? Low-fat. ? Sodium-free. ? Low-sodium.  Look for lean ground beef labeled as at least 92% lean and 8% fat. Shopping  Buy only the items on your grocery list and go only to the areas of the store that have the items on your list.  Use coupons only for  foods and brands you normally buy. Avoid buying items you wouldn't normally buy simply because they are on sale.  Check online and in newspapers for weekly deals.  Buy healthy items from the bulk bins when available, such as herbs, spices, flour, pasta, nuts, and dried fruit.  Buy fruits and vegetables that are in season. Prices are usually lower on in-season produce.  Look at the unit price on the price tag. Use it to compare different brands and sizes to find out which item is the best deal.  Choose healthy items that are often low-cost, such as carrots, potatoes, apples, bananas, and oranges. Dried or canned beans are a low-cost protein source.  Buy in bulk and freeze extra food. Items you can buy in bulk include meats, fish, poultry, frozen fruits, and frozen vegetables.  Avoid buying "ready-to-eat" foods, such as pre-cut fruits and vegetables and pre-made salads.  If possible, shop around to discover where you can find the best prices. Consider other retailers such as dollar stores, larger wholesale stores, local fruit and vegetable stands, and farmers markets.  Do not shop when you are hungry. If you shop while hungry, it may be hard to stick to your list and budget.  Resist impulse buying. Use your grocery   list as your official plan for the week.  Buy a variety of vegetables and fruits by purchasing fresh, frozen, and canned items.  Look at the top and bottom shelves for deals. Foods at eye level (eye level of an adult or child) are usually more expensive.  Be efficient with your time when shopping. The more time you spend at the store, the more money you are likely to spend.  To save money when choosing more expensive foods like meats and dairy: ? Choose cheaper cuts of meat, such as bone-in chicken thighs and drumsticks instead of skinless and boneless chicken. When you are ready to prepare the chicken, you can remove the skin yourself to make it healthier. ? Choose lean meats  like chicken or [place] instead of beef. ? Choose canned seafood, such as tuna, salmon, or sardines. ? Buy eggs as a low-cost source of protein. ? Buy dried beans and peas, such as lentils, split peas, or kidney beans instead of meats. Dried beans and peas are a good alternative source of protein. ? Buy the larger tubs of yogurt instead of individual-sized containers.  Choose water instead of sodas and other sweetened beverages.  Avoid buying chips, cookies, and other "junk food." These items are usually expensive and not healthy.   Cooking  Make extra food and freeze the extras in meal-sized containers or in individual portions for fast meals and snacks.  Pre-cook on days when you have extra time to prepare meals in advance. You can keep these meals in the fridge or freezer and reheat for a quick meal.  When you come home from the grocery store, wash, peel, and cut fruits and vegetables so they are ready to use and eat. This will help reduce food waste. Meal planning  Do not eat out or get fast food. Prepare food at home.  Make a grocery list and make sure to bring it with you to the store. If you have a smart phone, you could use your phone to create your shopping list.  Plan meals and snacks according to a grocery list and budget you create.  Use leftovers in your meal plan for the week.  Look for recipes where you can cook once and make enough food for two meals.  Prepare budget-friendly types of meals like stews, casseroles, and stir-fry dishes.  Try some meatless meals or try "no cook" meals like salads.  Make sure that half your plate is filled with fruits or vegetables. Choose from fresh, frozen, or canned fruits and vegetables. If eating canned, remember to rinse them before eating. This will remove any excess salt added for packaging. Summary  Eating healthy on a budget is possible if you plan your meals according to your budget, purchase according to your budget and  grocery list, and prepare food yourself.  Tips for buying more food on a limited budget include buying generic brands, using coupons only for foods you normally buy, and buying healthy items from the bulk bins when available.  Tips for buying cheaper food to replace expensive food include choosing cheaper, lean cuts of meat, and buying dried beans and peas. This information is not intended to replace advice given to you by your health care provider. Make sure you discuss any questions you have with your health care provider. Document Revised: 05/10/2020 Document Reviewed: 05/10/2020 Elsevier Patient Education  [place] protect organs, store calcium, anchor muscles, and support the whole body. Keeping your bones  strong is important, especially as you get older. You can take actions to help keep your bones strong and healthy. Why is keeping my bones healthy important? Keeping your bones healthy is important because your body constantly replaces bone cells. Cells get old, and new cells take their place. As we age, we lose bone cells because the body may not be able to make enough new cells to replace the old cells. The amount of bone cells and bone tissue you have is referred to as bone mass. The higher your bone mass, the stronger your bones. The aging process leads to an overall loss of bone mass in the body, which can increase the likelihood of:  Joint pain and stiffness.  Broken bones.  A condition in which the bones become weak and brittle (osteoporosis). A large decline in bone mass occurs in older adults. In women, it occurs about the time of menopause.   What actions can I take to keep my bones healthy? Good health habits are important for maintaining healthy bones. This includes eating nutritious foods and exercising regularly. To have healthy bones, you need to get enough of the right minerals and vitamins. Most nutrition experts recommend getting these  nutrients from the foods that you eat. In some cases, taking supplements may also be recommended. Doing certain types of exercise is also important for bone health. What are the nutritional recommendations for healthy bones? Eating a well-balanced diet with plenty of calcium and vitamin D will help to protect your bones. Nutritional recommendations vary from person to person. Ask your health care provider what is healthy for you. Here are some general guidelines. Get enough calcium Calcium is the most important (essential) mineral for bone health. Most people can get enough calcium from their diet, but supplements may be recommended for people who are at risk for osteoporosis. Good sources of calcium include:  Dairy products, such as low-fat or nonfat milk, cheese, and yogurt.  Dark green leafy vegetables, such as bok choy and broccoli.  Calcium-fortified foods, such as orange juice, cereal, bread, soy beverages, and tofu products.  Nuts, such as almonds. Follow these recommended amounts for daily calcium intake:  Children, age 1-3: 700 mg.  Children, age 4-8: 1,000 mg.  Children, age 9-13: 1,300 mg.  Teens, age 14-18: 1,300 mg.  Adults, age 19-50: 1,000 mg.  Adults, age 51-70: ? Men: 1,000 mg. ? Women: 1,200 mg.  Adults, age 71 or older: 1,200 mg.  Pregnant and breastfeeding females: ? Teens: 1,300 mg. ? Adults: 1,000 mg. Get enough vitamin D Vitamin D is the most essential vitamin for bone health. It helps the body absorb calcium. Sunlight stimulates the skin to make vitamin D, so be sure to get enough sunlight. If you live in a cold climate or you do not get outside often, your health care provider may recommend that you take vitamin D supplements. Good sources of vitamin D in your diet include:  Egg yolks.  Saltwater fish.  Milk and cereal fortified with vitamin D. Follow these recommended amounts for daily vitamin D intake:  Children and teens, age 1-18: 600  international units.  Adults, age 50 or younger: 400-800 international units.  Adults, age 51 or older: 800-1,000 international units. Get other important nutrients Other nutrients that are important for bone health include:  Phosphorus. This mineral is found in meat, poultry, dairy foods, nuts, and legumes. The recommended daily intake for adult men and adult women is 700 mg.  Magnesium. This mineral   is found in seeds, nuts, dark green vegetables, and legumes. The recommended daily intake for adult men is 400-420 mg. For adult women, it is 310-320 mg.  Vitamin K. This vitamin is found in green leafy vegetables. The recommended daily intake is 120 mg for adult men and 90 mg for adult women.   What type of physical activity is best for building and maintaining healthy bones? Weight-bearing and strength-building activities are important for building and maintaining healthy bones. Weight-bearing activities cause muscles and bones to work against gravity. Strength-building activities increase the strength of the muscles that support bones. Weight-bearing and muscle-building activities include:  Walking and hiking.  Jogging and running.  Dancing.  Gym exercises.  Lifting weights.  Tennis and racquetball.  Climbing stairs.  Aerobics. Adults should get at least 30 minutes of moderate physical activity on most days. Children should get at least 60 minutes of moderate physical activity on most days. Ask your health care provider what type of exercise is best for you.   How can I find out if my bone mass is low? Bone mass can be measured with an X-ray test called a bone mineral density (BMD) test. This test is recommended for all women who are age 53 or older. It may also be recommended for:  Men who are age 75 or older.  People who are at risk for osteoporosis because of: ? Having bones that break easily. ? Having a long-term disease that weakens bones, such as kidney disease or  rheumatoid arthritis. ? Having menopause earlier than normal. ? Taking medicine that weakens bones, such as steroids, thyroid hormones, or hormone treatment for breast cancer or prostate cancer. ? Smoking. ? Drinking three or more alcoholic drinks a day. If you find that you have a low bone mass, you may be able to prevent osteoporosis or further bone loss by changing your diet and lifestyle. Where can I find more information? For more information, check out the following websites:  [place]: AviationTales.fr  [employer] of Health: www.bones.SouthExposed.es  International Osteoporosis Foundation: Administrator.iofbonehealth.org Summary  The aging process leads to an overall loss of bone mass in the body, which can increase the likelihood of broken bones and osteoporosis.  Eating a well-balanced diet with plenty of calcium and vitamin D will help to protect your bones.  Weight-bearing and strength-building activities are also important for building and maintaining strong bones.  Bone mass can be measured with an X-ray test called a bone mineral density (BMD) test. This information is not intended to replace advice given to you by your health care provider. Make sure you discuss any questions you have with your health care provider. Document Revised: 08/24/2017 Document Reviewed: 08/24/2017 Elsevier Patient Education  2021 [place], Adult After being diagnosed with an anxiety disorder, you may be relieved to know why you have felt or behaved a certain way. You may also feel overwhelmed about the treatment ahead and what it will mean for your life. With care and support, you can manage this condition and recover from it. How to manage lifestyle changes Managing stress and anxiety Stress is your body's reaction to life changes and events, both good and bad. Most stress will last just a few hours, but stress can be ongoing and can lead to more  than just stress. Although stress can play a major role in anxiety, it is not the same as anxiety. Stress is usually caused by something external, such as a  deadline, test, or competition. Stress normally passes after the triggering event has ended.  Anxiety is caused by something internal, such as imagining a terrible outcome or worrying that something will go wrong that will devastate you. Anxiety often does not go away even after the triggering event is over, and it can become long-term (chronic) worry. It is important to understand the differences between stress and anxiety and to manage your stress effectively so that it does not lead to an anxious response. Talk with your health care provider or a counselor to learn more about reducing anxiety and stress. He or she may suggest tension reduction techniques, such as:  Music therapy. This can include creating or listening to music that you enjoy and that inspires you.  Mindfulness-based meditation. This involves being aware of your normal breaths while not trying to control your breathing. It can be done while sitting or walking.  Centering prayer. This involves focusing on a word, phrase, or sacred image that means something to you and brings you peace.  Deep breathing. To do this, expand your stomach and inhale slowly through your nose. Hold your breath for 3-5 seconds. Then exhale slowly, letting your stomach muscles relax.  Self-talk. This involves identifying thought patterns that lead to anxiety reactions and changing those patterns.  Muscle relaxation. This involves tensing muscles and then relaxing them. Choose a tension reduction technique that suits your lifestyle and personality. These techniques take time and practice. Set aside 5-15 minutes a day to do them. Therapists can offer counseling and training in these techniques. The training to help with anxiety may be covered by some insurance plans. Other things you can do to manage stress  and anxiety include:  Keeping a stress/anxiety diary. This can help you learn what triggers your reaction and then learn ways to manage your response.  Thinking about how you react to certain situations. You may not be able to control everything, but you can control your response.  Making time for activities that help you relax and not feeling guilty about spending your time in this way.  Visual imagery and yoga can help you stay calm and relax.   Medicines Medicines can help ease symptoms. Medicines for anxiety include:  Anti-anxiety drugs.  Antidepressants. Medicines are often used as a primary treatment for anxiety disorder. Medicines will be prescribed by a health care provider. When used together, medicines, psychotherapy, and tension reduction techniques may be the most effective treatment. Relationships Relationships can play a big part in helping you recover. Try to spend more time connecting with trusted friends and family members. Consider going to couples counseling, taking family education classes, or going to family therapy. Therapy can help you and others better understand your condition. How to recognize changes in your anxiety Everyone responds differently to treatment for anxiety. Recovery from anxiety happens when symptoms decrease and stop interfering with your daily activities at home or work. This may mean that you will start to:  Have better concentration and focus. Worry will interfere less in your daily thinking.  Sleep better.  Be less irritable.  Have more energy.  Have improved memory. It is important to recognize when your condition is getting worse. Contact your health care provider if your symptoms interfere with home or work and you feel like your condition is not improving. Follow these instructions at home: Activity  Exercise. Most adults should do the following: ? Exercise for at least 150 minutes each week. The exercise should increase your heart  rate and make you sweat (moderate-intensity exercise). ? Strengthening exercises at least twice a week.  Get the right amount and quality of sleep. Most adults need 7-9 hours of sleep each night. Lifestyle  Eat a healthy diet that includes plenty of vegetables, fruits, whole grains, low-fat dairy products, and lean protein. Do not eat a lot of foods that are high in solid fats, added sugars, or salt.  Make choices that simplify your life.  Do not use any products that contain nicotine or tobacco, such as cigarettes, e-cigarettes, and chewing tobacco. If you need help quitting, ask your health care provider.  Avoid caffeine, alcohol, and certain over-the-counter cold medicines. These may make you feel worse. Ask your pharmacist which medicines to avoid.   General instructions  Take over-the-counter and prescription medicines only as told by your health care provider.  Keep all follow-up visits as told by your health care provider. This is important. Where to find support You can get help and support from these sources:  Self-help groups.  Online and [employer] Corporationcommunity organizations.  A trusted spiritual leader.  Couples counseling.  Family education classes.  Family therapy. Where to find more information You may find that joining a support group helps you deal with your anxiety. The following sources can help you locate counselors or support groups near you:  Mental Health America: www.mentalhealthamerica.net  Anxiety and Depression Association of [place] (ADAA): ProgramCam.dewww.adaa.org  The [employer] Alliance on Mental Illness (NAMI): www.nami.org Contact a health care provider if you:  Have a hard time staying focused or finishing daily tasks.  Spend many hours a day feeling worried about everyday life.  Become exhausted by worry.  Start to have headaches, feel tense, or have nausea.  Urinate more than normal.  Have diarrhea. Get help right away if you have:  A racing heart and  shortness of breath.  Thoughts of hurting yourself or others. If you ever feel like you may hurt yourself or others, or have thoughts about taking your own life, get help right away. You can go to your nearest emergency department or call:  Your local emergency services (911 in the [place]  A suicide crisis helpline, such as the National Suicide Prevention Lifeline at (857)689-52481-843-430-7953. This is open 24 hours a day. Summary  Taking steps to learn and use tension reduction techniques can help calm you and help prevent triggering an anxiety reaction.  When used together, medicines, psychotherapy, and tension reduction techniques may be the most effective treatment.  Family, friends, and partners can play a big part in helping you recover from an anxiety disorder. This information is not intended to replace advice given to you by your health care provider. Make sure you discuss any questions you have with your health care provider. Document Revised: 12/28/2018 Document Reviewed: 12/28/2018 Elsevier Patient Education  2021 [employer]   Managing the Challenge of Quitting Smoking Quitting smoking is a physical and mental challenge. You will face cravings, withdrawal symptoms, and temptation. Before quitting, work with your health care provider to make a plan that can help you manage quitting. Preparation can help you quit and keep you from giving in. How to manage lifestyle changes Managing stress Stress can make you want to smoke, and wanting to smoke may cause stress. It is important to find ways to manage your stress. You might try some of the following:  Practice relaxation techniques. ? Breathe slowly and deeply, in through your nose and out through your mouth. ? Listen to music. ?  Soak in a bath or take a shower. ? Imagine a peaceful place or vacation.  Get some support. ? Talk with family or friends about your stress. ? Join a support group. ? Talk with a counselor or  therapist.  Get some physical activity. ? Go for a walk, run, or bike ride. ? Play a favorite sport. ? Practice yoga.   Medicines Talk with your health care provider about medicines that might help you deal with cravings and make quitting easier for you. Relationships Social situations can be difficult when you are quitting smoking. To manage this, you can:  Avoid parties and other social situations where people might be smoking.  Avoid alcohol.  Leave right away if you have the urge to smoke.  Explain to your family and friends that you are quitting smoking. Ask for support and let them know you might be a bit grumpy.  Plan activities where smoking is not an option. General instructions Be aware that many people gain weight after they quit smoking. However, not everyone does. To keep from gaining weight, have a plan in place before you quit and stick to the plan after you quit. Your plan should include:  Having healthy snacks. When you have a craving, it may help to: ? Eat popcorn, carrots, celery, or other cut vegetables. ? Chew sugar-free gum.  Changing how you eat. ? Eat small portion sizes at meals. ? Eat 4-6 small meals throughout the day instead of 1-2 large meals a day. ? Be mindful when you eat. Do not watch television or do other things that might distract you as you eat.  Exercising regularly. ? Make time to exercise each day. If you do not have time for a long workout, do short bouts of exercise for 5-10 minutes several times a day. ? Do some form of strengthening exercise, such as weight lifting. ? Do some exercise that gets your heart beating and causes you to breathe deeply, such as walking fast, running, swimming, or biking. This is very important.  Drinking plenty of water or other low-calorie or no-calorie drinks. Drink 6-8 glasses of water daily.   How to recognize withdrawal symptoms Your body and mind may experience discomfort as you try to get used to not  having nicotine in your system. These effects are called withdrawal symptoms. They may include:  Feeling hungrier than normal.  Having trouble concentrating.  Feeling irritable or restless.  Having trouble sleeping.  Feeling depressed.  Craving a cigarette. To manage withdrawal symptoms:  Avoid places, people, and activities that trigger your cravings.  Remember why you want to quit.  Get plenty of sleep.  Avoid coffee and other caffeinated drinks. These may worsen some of your symptoms. These symptoms may surprise you. But be assured that they are normal to have when quitting smoking. How to manage cravings Come up with a plan for how to deal with your cravings. The plan should include the following:  A definition of the specific situation you want to deal with.  An alternative action you will take.  A clear idea for how this action will help.  The name of someone who might help you with this. Cravings usually last for 5-10 minutes. Consider taking the following actions to help you with your plan to deal with cravings:  Keep your mouth busy. ? Chew sugar-free gum. ? Suck on hard candies or a straw. ? Brush your teeth.  Keep your hands and body busy. ? Change to a  different activity right away. ? Squeeze or play with a ball. ? Do an activity or a hobby, such as making bead jewelry, practicing needlepoint, or working with wood. ? Mix up your normal routine. ? Take a short exercise break. Go for a quick walk or run up and down stairs.  Focus on doing something kind or helpful for someone else.  Call a friend or family member to talk during a craving.  Join a support group.  Contact a quitline. Where to find support To get help or find a support group:  Call the National Cancer Institute's Smoking Quitline: 1-800-QUIT NOW 445 808 2848)  Visit the website of the Substance Abuse and Mental Health Services Administration: SkateOasis.com.pt  Text QUIT to SmokefreeTXT:  151761 Where to find more information Visit these websites to find more information on quitting smoking:  National Cancer Institute: www.smokefree.gov  American Lung Association: www.lung.org  American Cancer Society: www.cancer.org  Centers for Disease Control and Prevention: FootballExhibition.com.br  American Heart Association: www.heart.org Contact a health care provider if:  You want to change your plan for quitting.  The medicines you are taking are not helping.  Your eating feels out of control or you cannot sleep. Get help right away if:  You feel depressed or become very anxious. Summary  Quitting smoking is a physical and mental challenge. You will face cravings, withdrawal symptoms, and temptation to smoke again. Preparation can help you as you go through these challenges.  Try different techniques to manage stress, handle social situations, and prevent weight gain.  You can deal with cravings by keeping your mouth busy (such as by chewing gum), keeping your hands and body busy, calling family or friends, or contacting a quitline for people who want to quit smoking.  You can deal with withdrawal symptoms by avoiding places where people smoke, getting plenty of rest, and avoiding drinks with caffeine. This information is not intended to replace advice given to you by your health care provider. Make sure you discuss any questions you have with your health care provider. Document Revised: 05/17/2019 Document Reviewed: 05/17/2019 Elsevier Patient Education  2021 [employer]  Diabetes Care, 44(Suppl 1), [phone]) [phone]. https://doi.org/https://doi.org/10.2337/dc21-S003">  Prediabetes Prediabetes is when your blood sugar (blood glucose) level is higher than normal but not high enough for you to be diagnosed with type 2 diabetes. Having prediabetes puts you at risk for developing type 2 diabetes (type 2 diabetes mellitus). With certain lifestyle changes, you may be able to prevent or delay  the onset of type 2 diabetes. This is important because type 2 diabetes can lead to serious complications, such as:  Heart disease.  Stroke.  Blindness.  Kidney disease.  Depression.  Poor circulation in the feet and legs. In severe cases, this could lead to surgical removal of a leg (amputation). What are the causes? The exact cause of prediabetes is not known. It may result from insulin resistance. Insulin resistance develops when cells in the body do not respond properly to insulin that the body makes. This can cause excess glucose to build up in the blood. High blood glucose (hyperglycemia) can develop. What increases the risk? The following factors may make you more likely to develop this condition:  You have a family member with type 2 diabetes.  You are older than 45 years.  You had a temporary form of diabetes during a pregnancy (gestational diabetes).  You had polycystic ovary syndrome (PCOS).  You are overweight or obese.  You are inactive (sedentary).  You  have a history of heart disease, including problems with cholesterol levels, high levels of blood fats, or high blood pressure. What are the signs or symptoms? You may have no symptoms. If you do have symptoms, they may include:  Increased hunger.  Increased thirst.  Increased urination.  Vision changes, such as blurry vision.  Tiredness (fatigue). How is this diagnosed? This condition can be diagnosed with blood tests. Your blood glucose may be checked with one or more of the following tests:  A fasting blood glucose (FBG) test. You will not be allowed to eat (you will fast) for at least 8 hours before a blood sample is taken.  An A1C blood test (hemoglobin A1C). This test provides information about blood glucose levels over the previous 2?3 months.  An oral glucose tolerance test (OGTT). This test measures your blood glucose at two points in time: ? After fasting. This is your baseline level. ? Two  hours after you drink a beverage that contains glucose. You may be diagnosed with prediabetes if:  Your FBG is 100?125 mg/dL (8.2-9.5 mmol/L).  Your A1C level is 5.7?6.4% (39-46 mmol/mol).  Your OGTT result is 140?199 mg/dL (6.2-13 mmol/L). These blood tests may be repeated to confirm your diagnosis.   How is this treated? Treatment may include dietary and lifestyle changes to help lower your blood glucose and prevent type 2 diabetes from developing. In some cases, medicine may be prescribed to help lower the risk of type 2 diabetes. Follow these instructions at home: Nutrition  Follow a healthy meal plan. This includes eating lean proteins, whole grains, legumes, fresh fruits and vegetables, low-fat dairy products, and healthy fats.  Follow instructions from your health care provider about eating or drinking restrictions.  Meet with a dietitian to create a healthy eating plan that is right for you.   Lifestyle  Do moderate-intensity exercise for at least 30 minutes a day on 5 or more days each week, or as told by your health care provider. A mix of activities may be best, such as: ? Brisk walking, swimming, biking, and weight lifting.  Lose weight as told by your health care provider. Losing 5-7% of your body weight can reverse insulin resistance.  Do not drink alcohol if: ? Your health care provider tells you not to drink. ? You are pregnant, may be pregnant, or are planning to become pregnant.  If you drink alcohol: ? Limit how much you use to:  0-1 drink a day for women.  0-2 drinks a day for men. ? Be aware of how much alcohol is in your drink. In the [place] one drink equals one 12 oz bottle of beer (355 mL), one 5 oz glass of wine (148 mL), or one 1 oz glass of hard liquor (44 mL). General instructions  Take over-the-counter and prescription medicines only as told by your health care provider. You may be prescribed medicines that help lower the risk of type 2  diabetes.  Do not use any products that contain nicotine or tobacco, such as cigarettes, e-cigarettes, and chewing tobacco. If you need help quitting, ask your health care provider.  Keep all follow-up visits. This is important. Where to find more information  American Diabetes Association: www.diabetes.org  Academy of Nutrition and Dietetics: www.eatright.org  American Heart Association: www.heart.org Contact a health care provider if:  You have any of these symptoms: ? Increased hunger. ? Increased urination. ? Increased thirst. ? Fatigue. ? Vision changes, such as blurry vision. Get help right  away if you:  Have shortness of breath.  Feel confused.  Vomit or feel like you may vomit. Summary  Prediabetes is when your blood sugar (blood glucose)level is higher than normal but not high enough for you to be diagnosed with type 2 diabetes.  Having prediabetes puts you at risk for developing type 2 diabetes (type 2 diabetes mellitus).  Make lifestyle changes such as eating a healthy diet and exercising regularly to help prevent diabetes. Lose weight as told by your health care provider. This information is not intended to replace advice given to you by your health care provider. Make sure you discuss any questions you have with your health care provider. Document Revised: 10/27/2019 Document Reviewed: 10/27/2019 Elsevier Patient Education  2021 [employer].

## 2020-11-02 NOTE — Progress Notes (Signed)
Gynecology Annual Exam  PCP: Carren Rang, PA-C  Chief Complaint:  Chief Complaint  Patient presents with  . Gynecologic Exam    History of Present Illness: Patient is a 25 y.o. G0P0000 presents for annual exam. The patient has no complaints today.   LMP: No LMP recorded. Patient has had an implant. Not having a menstrual cycle with implant in place Heavy Menses: no Intermenstrual Bleeding: no Dysmenorrhea: no  The patient does perform self breast exams.  There is no notable family history of breast or ovarian cancer in her family.  The patient reports her exercise generally consists of walking, started a 07/13/29 plan.  The patient reports current symptoms of anxiety.   PHQ-9: 5 GAD-7: 4   Reports stress in friendships, work, and personal life.  Has been working to reduce vaping and nicotine dependency. Has been increasing her amount of exercise and improving her diet.    Review of Systems: Review of Systems  Constitutional: Negative for chills, fever, malaise/fatigue and weight loss.  HENT: Negative for congestion, hearing loss and sinus pain.   Eyes: Negative for blurred vision and double vision.  Respiratory: Negative for cough, sputum production, shortness of breath and wheezing.   Cardiovascular: Negative for chest pain, palpitations, orthopnea and leg swelling.  Gastrointestinal: Negative for abdominal pain, constipation, diarrhea, nausea and vomiting.  Genitourinary: Negative for dysuria, flank pain, frequency, hematuria and urgency.  Musculoskeletal: Negative for back pain, falls and joint pain.  Skin: Negative for itching and rash.  Neurological: Negative for dizziness and headaches.  Psychiatric/Behavioral: Negative for depression, substance abuse and suicidal ideas. The patient is not nervous/anxious.     Past Medical History:  Past Medical History:  Diagnosis Date  . ADHD (attention deficit hyperactivity disorder)   . Adopted   . Anxiety   .  Back pain   . Depression   . Diabetes mellitus without complication (HCC) 2019  . Frequent headaches   . GERD (gastroesophageal reflux disease)    OCC- NO MEDS  . Pre-diabetes     Past Surgical History:  Past Surgical History:  Procedure Laterality Date  . COLONOSCOPY  03/2019  . GANGLION CYST EXCISION    . PILONIDAL CYST EXCISION N/A 04/17/2016   Procedure: CYST EXCISION PILONIDAL EXTENSIVE;  Surgeon: Nadeen Landau, MD;  Location: [facility] ORS;  Service: General;  Laterality: N/A;  . TONSILLECTOMY    . WISDOM TOOTH EXTRACTION      Gynecologic History:  No LMP recorded. Patient has had an implant. Menarche: 11  History of fibroids, polyps, or ovarian cysts? : no  History of PCOS? no Hstory of Endometriosis? no History of abnormal pap smears? no Have you had any sexually transmitted infections in the past? no  She reports HPV vaccination in the past.   Last Pap: Results were: unknown   She identifies as a female. She is sexually active with men and women.   She denies dyspareunia. She denies postcoital bleeding.  She currently uses condoms and Nexplanon for contraception.    Obstetric History: G0P0000  Family History:  Family History  Adopted: Yes  Problem Relation Age of Onset  . Testicular cancer Maternal Uncle   . Diabetes Maternal Grandmother   . Uterine cancer Maternal Grandmother     Social History:  Social History   Socioeconomic History  . Marital status: Single    Spouse name: Not on file  . Number of children: Not on file  . Years of education: Not on file  .  Highest education level: Not on file  Occupational History  . Not on file  Tobacco Use  . Smoking status: Former Games developer  . Smokeless tobacco: Never Used  . Tobacco comment: vape 1x every other day  Vaping Use  . Vaping Use: Every day  Substance and Sexual Activity  . Alcohol use: No    Alcohol/week: 0.0 standard drinks  . Drug use: No  . Sexual activity: Yes    Birth  control/protection: Implant  Other Topics Concern  . Not on file  Social History Narrative  . Not on file   Social Determinants of Health   Financial Resource Strain: Not on file  Food Insecurity: Not on file  Transportation Needs: Not on file  Physical Activity: Not on file  Stress: Not on file  Social Connections: Not on file  Intimate Partner Violence: Not on file    Allergies:  No Known Allergies  Medications: Prior to Admission medications   Medication Sig Start Date End Date Taking? Authorizing Provider  etonogestrel (NEXPLANON) 68 MG IMPL implant 1 each by Subdermal route once.     [provider]    Physical Exam Vitals: Blood pressure 124/72, height 5\' 2"  (1.575 m), weight 249 lb 3.2 oz (113 kg).  Physical Exam Constitutional:      Appearance: She is well-developed.  Genitourinary:     Genitourinary Comments: External: Normal appearing vulva. No lesions noted.  Speculum examination: Normal appearing cervix. Dark red blood in the vaginal vault. no discharge.   Bimanual examination: Uterus midline, non-tender, normal in size, shape and contour.  No CMT. No adnexal masses. No adnexal tenderness. Pelvis not fixed.  Breast Exam: breast equal without skin changes, nipple discharge, breast lump or enlarged lymph nodes   HENT:     Head: Normocephalic and atraumatic.  Neck:     Thyroid: No thyromegaly.  Cardiovascular:     Rate and Rhythm: Normal rate and regular rhythm.     Heart sounds: Normal heart sounds.  Pulmonary:     Effort: Pulmonary effort is normal.     Breath sounds: Normal breath sounds.  Abdominal:     General: Bowel sounds are normal. There is no distension.     Palpations: Abdomen is soft. There is no mass.  Musculoskeletal:     Cervical back: Neck supple.  Neurological:     Mental Status: She is alert and oriented to person, place, and time.  Skin:    General: Skin is warm and dry.  Psychiatric:        Behavior: Behavior normal.         Thought Content: Thought content normal.        Judgment: Judgment normal.  Vitals reviewed.      Female chaperone present for pelvic and breast  portions of the physical exam  Assessment: 25 y.o. G0P0000 routine annual exam  Plan: Problem List Items Addressed This Visit   None   Visit Diagnoses    Cervical cancer screening    -  Primary   Relevant Orders   Cytology - PAP      1) STI screening was offered and accepted  2) ASCCP guidelines and rational discussed.  Patient opts for every 3 years screening interval  3) Contraception - continue nexplanon  4) Routine healthcare maintenance including cholesterol, diabetes screening discussed managed by PCP. Will check hgba1c today. She has a history of Type 2 diabetes, but has not been taking metformin or insulin.   5) Discussed strategies  to help quitting vaping  6) Discussed strategies for managing anxiety and challening social situations.   Adelene Idler MD, [facility], [facility] 11/02/2020 10:36 AM

## 2020-11-03 LAB — HEMOGLOBIN A1C
Est. average glucose Bld gHb Est-mCnc: 126 mg/dL
Hgb A1c MFr Bld: 6 % — ABNORMAL HIGH (ref 4.8–5.6)

## 2020-11-05 LAB — CYTOLOGY - PAP
Chlamydia: NEGATIVE
Comment: NEGATIVE
Comment: NEGATIVE
Comment: NORMAL
Diagnosis: NEGATIVE
Neisseria Gonorrhea: NEGATIVE
Trichomonas: NEGATIVE

## 2020-12-22 IMAGING — US ULTRASOUND ABDOMEN LIMITED
1 series · 14 of 25 positions shown · non-contrast
Comparison: None.

CLINICAL DATA: Intermittent nausea vomiting.

EXAM:
ULTRASOUND ABDOMEN LIMITED RIGHT UPPER QUADRANT

[Series 1: ultrasound abdomen limited · 14 of 42 slices shown]
[im 1/42]
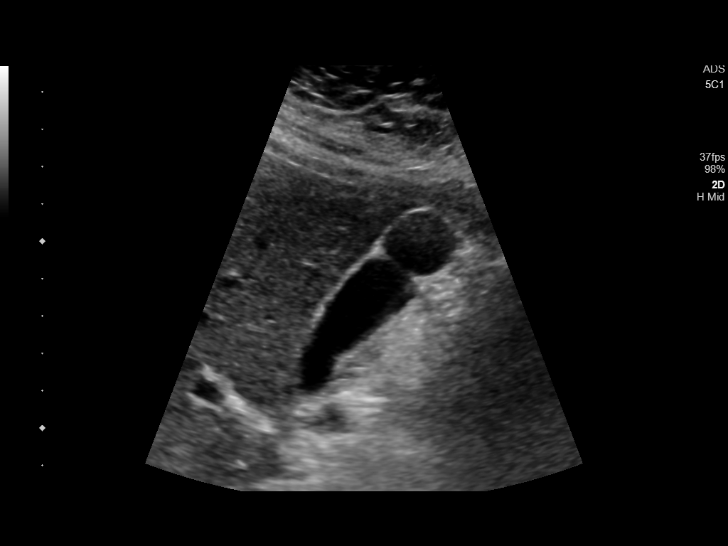
[im 4/42]
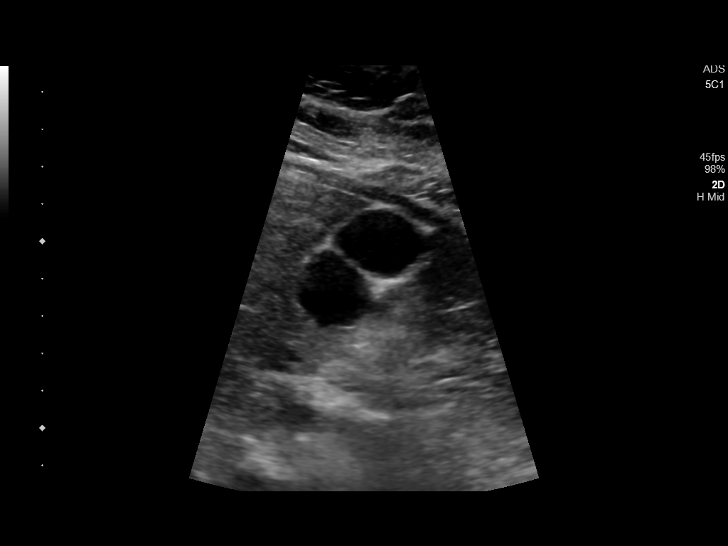
[im 7/42]
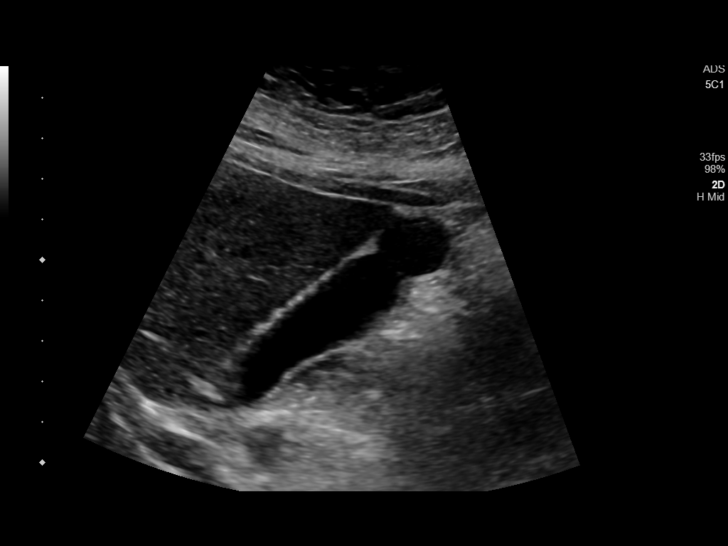
[im 11/42]
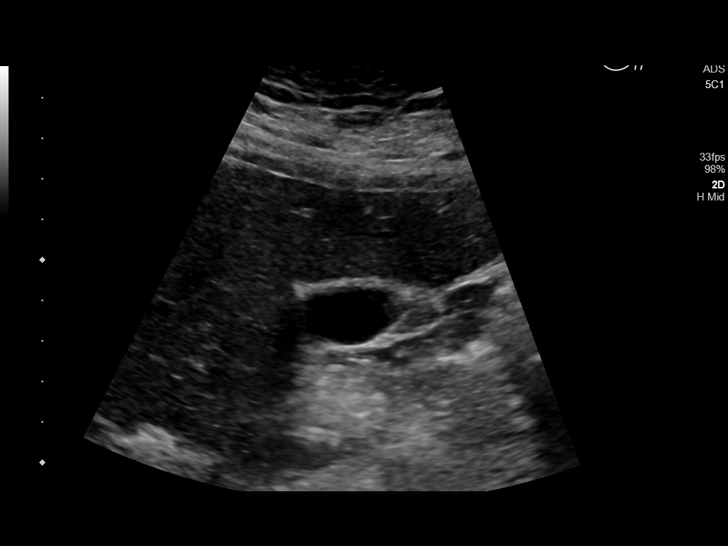
[im 14/42]
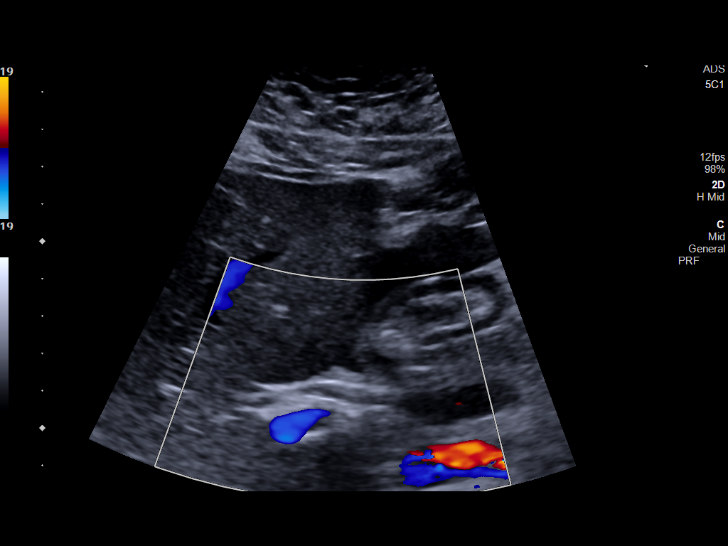
[im 16/42]
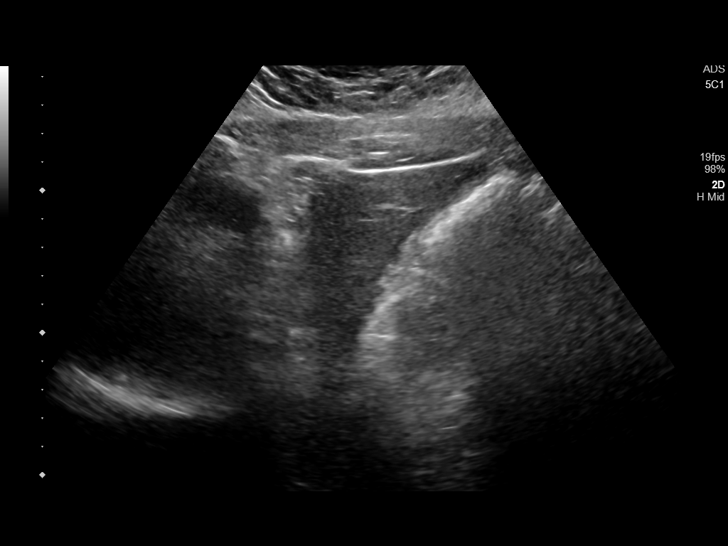
[im 19/42]
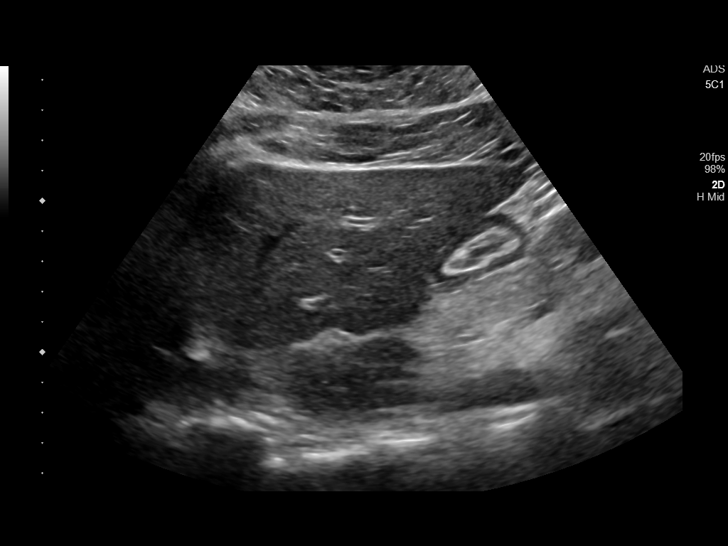
[im 23/42]
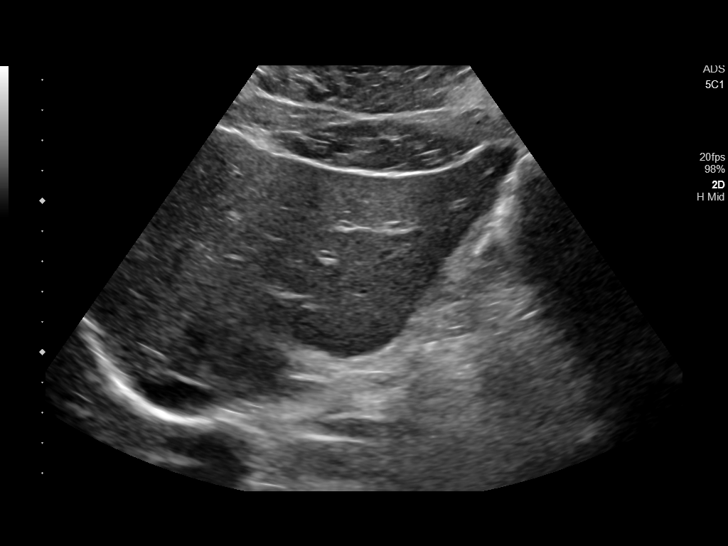
[im 26/42]
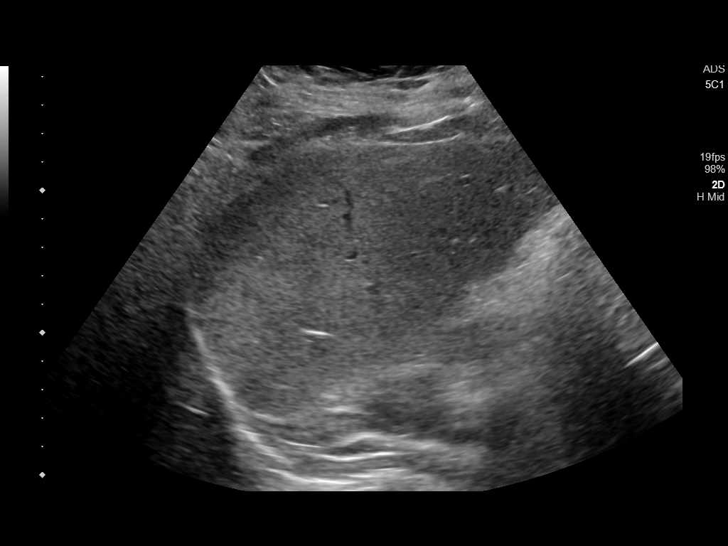
[im 28/42]
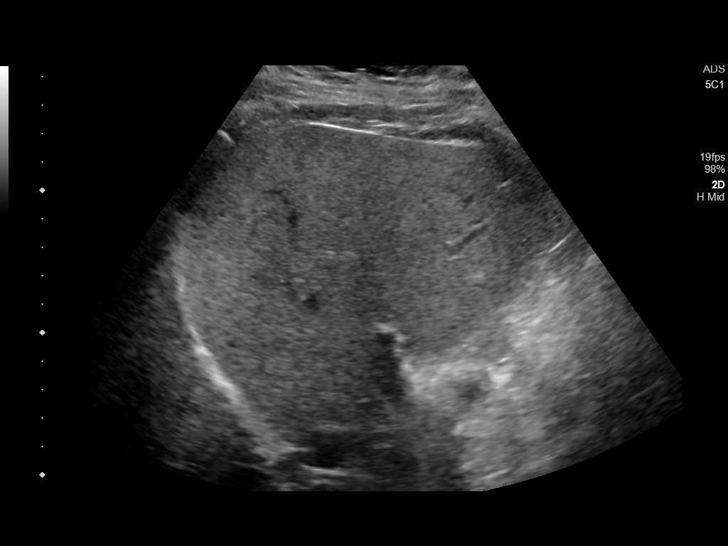
[im 31/42]
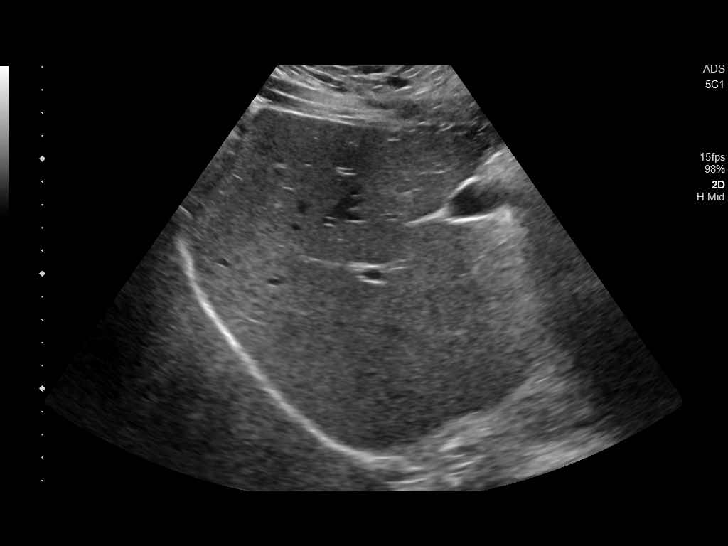
[im 35/42]
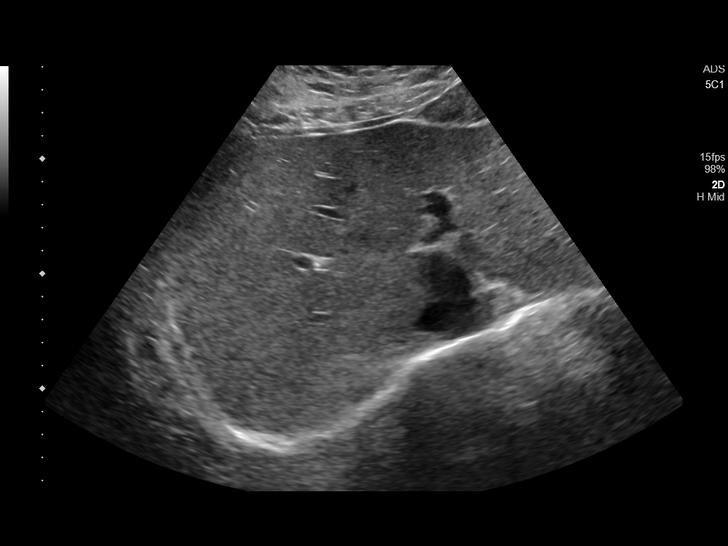
[im 38/42]
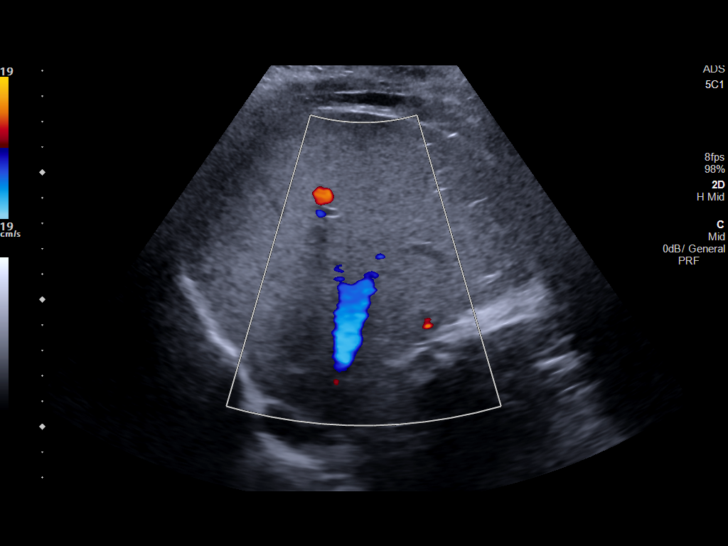
[im 42/42]
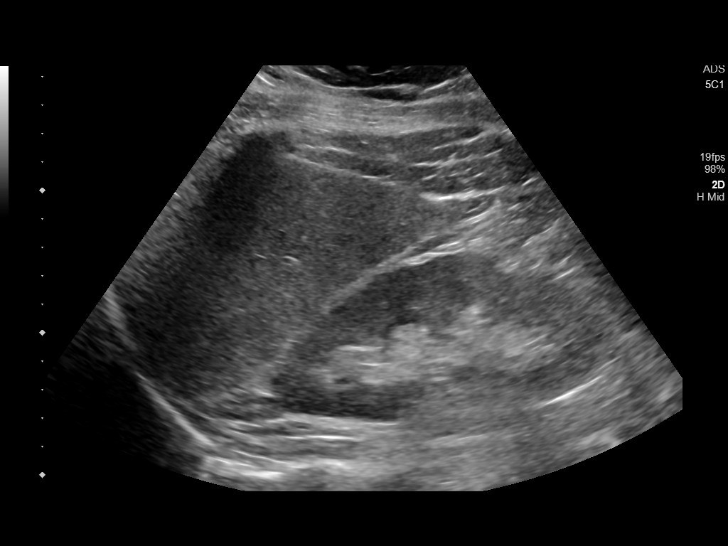

[14 of 25 positions shown; findings below may reference images not displayed]

FINDINGS: Gallbladder:

No gallstones or wall thickening visualized. No sonographic Murphy
sign noted by sonographer.

Common bile duct:

Diameter: 2.5 mm

Liver:

No focal lesion identified. Within normal limits in parenchymal
echogenicity. Portal vein is patent on color Doppler imaging with
normal direction of blood flow towards the liver.
IMPRESSION: Normal right upper quadrant ultrasound.

## 2021-06-03 NOTE — Telephone Encounter (Signed)
Nexplanon rcvd/charged 09/07/2020

## 2021-07-25 ENCOUNTER — Telehealth: Payer: PRIVATE HEALTH INSURANCE

## 2021-09-10 ENCOUNTER — Other Ambulatory Visit: Payer: Self-pay | Admitting: Orthopedic Surgery

## 2021-09-16 ENCOUNTER — Other Ambulatory Visit
Admission: RE | Admit: 2021-09-16 | Discharge: 2021-09-16 | Disposition: A | Discharge status: Home / Self Care | Payer: PRIVATE HEALTH INSURANCE | Source: Ambulatory Visit | Attending: Orthopedic Surgery | Admitting: Orthopedic Surgery

## 2021-09-16 ENCOUNTER — Other Ambulatory Visit: Payer: Self-pay

## 2021-09-16 NOTE — Patient Instructions (Addendum)
Your procedure is scheduled on: 09/19/21 - Thursday Report to the Registration Desk on the 1st floor of the [facility]. To find out your arrival time, please call [phone] between 1PM - 3PM on: 09/18/21 - Wednesday  REMEMBER: Instructions that are not followed completely may result in serious medical risk, up to and including death; or upon the discretion of your surgeon and anesthesiologist your surgery may need to be rescheduled.  Do not eat food after midnight the night before surgery.  No gum chewing, lozengers or hard candies.  You may however, drink CLEAR liquids up to 2 hours before you are scheduled to arrive for your surgery. Do not drink anything within 2 hours of your scheduled arrival time.  Clear liquids include: - water  - apple juice without pulp - gatorade (not RED, PURPLE, OR BLUE) - black coffee or tea (Do NOT add milk or creamers to the coffee or tea) Do NOT drink anything that is not on this list.  Type 1 and Type 2 diabetics should only drink water.  TAKE THESE MEDICATIONS THE MORNING OF SURGERY WITH A SIP OF WATER: NONE  One week prior to surgery: Stop Anti-inflammatories (NSAIDS) such as Advil, Aleve, Ibuprofen, Motrin, Naproxen, Naprosyn and Aspirin based products such as Excedrin, Goodys Powder, BC Powder.  Stop ANY OVER THE COUNTER supplements until after surgery.  You may take Tylenol if needed for pain up until the day of surgery.  No Alcohol for 24 hours before or after surgery.  No Smoking including e-cigarettes for 24 hours prior to surgery.  No chewable tobacco products for at least 6 hours prior to surgery.  No nicotine patches on the day of surgery.  Do not use any "recreational" drugs for at least a week prior to your surgery.  Please be advised that the combination of cocaine and anesthesia may have negative outcomes, up to and including death. If you test positive for cocaine, your surgery will be cancelled.  On the morning of  surgery brush your teeth with toothpaste and water, you may rinse your mouth with mouthwash if you wish. Do not swallow any toothpaste or mouthwash.  Do not wear jewelry, make-up, hairpins, clips or nail polish.  Do not wear lotions, powders, or perfumes.   Do not shave body from the neck down 48 hours prior to surgery just in case you cut yourself which could leave a site for infection.  Also, freshly shaved skin may become irritated if using the CHG soap.  Contact lenses, hearing aids and dentures may not be worn into surgery.  Do not bring valuables to the hospital. [facility] is not responsible for any missing/lost belongings or valuables.   Notify your doctor if there is any change in your medical condition (cold, fever, infection).  Wear comfortable clothing (specific to your surgery type) to the hospital.  After surgery, you can help prevent lung complications by doing breathing exercises.  Take deep breaths and cough every 1-2 hours. Your doctor may order a device called an Incentive Spirometer to help you take deep breaths. When coughing or sneezing, hold a pillow firmly against your incision with both hands. This is called splinting. Doing this helps protect your incision. It also decreases belly discomfort.  If you are being admitted to the hospital overnight, leave your suitcase in the car. After surgery it may be brought to your room.  If you are being discharged the day of surgery, you will not be allowed to drive home.  You will need a responsible adult (18 years or older) to drive you home and stay with you that night.   If you are taking public transportation, you will need to have a responsible adult (18 years or older) with you. Please confirm with your physician that it is acceptable to use public transportation.   Please call the [facility] at [phone] if you have any questions about these instructions.  Surgery Visitation  Policy:  Patients undergoing a surgery or procedure may have one family member or support person with them as long as that person is not COVID-19 positive or experiencing its symptoms.  That person may remain in the waiting area during the procedure and may rotate out with other people.  Inpatient Visitation:    Visiting hours are 7 a.m. to 8 p.m. Up to two visitors ages 16+ are allowed at one time in a patient room. The visitors may rotate out with other people during the day. Visitors must check out when they leave, or other visitors will not be allowed. One designated support person may remain overnight. The visitor must pass COVID-19 screenings, use hand sanitizer when entering and exiting the patients room and wear a mask at all times, including in the patients room. Patients must also wear a mask when staff or their visitor are in the room. Masking is required regardless of vaccination status.

## 2021-09-19 ENCOUNTER — Ambulatory Visit
Admission: RE | Admit: 2021-09-19 | Discharge: 2021-09-19 | Disposition: A | Discharge status: Home / Self Care | Payer: 59 | Source: Ambulatory Visit | Attending: Orthopedic Surgery | Admitting: Orthopedic Surgery

## 2021-09-19 ENCOUNTER — Encounter
Admission: RE | Disposition: A | Discharge status: Home / Self Care | Payer: Self-pay | Source: Ambulatory Visit | Attending: Orthopedic Surgery

## 2021-09-19 ENCOUNTER — Ambulatory Visit: Payer: 59 | Admitting: Certified Registered"

## 2021-09-19 ENCOUNTER — Encounter: Payer: Self-pay | Admitting: Orthopedic Surgery

## 2021-09-19 ENCOUNTER — Other Ambulatory Visit: Payer: Self-pay

## 2021-09-19 DIAGNOSIS — M67431 Ganglion, right wrist: Secondary | ICD-10-CM | POA: Insufficient documentation

## 2021-09-19 DIAGNOSIS — Z7984 Long term (current) use of oral hypoglycemic drugs: Secondary | ICD-10-CM | POA: Diagnosis not present

## 2021-09-19 DIAGNOSIS — K219 Gastro-esophageal reflux disease without esophagitis: Secondary | ICD-10-CM | POA: Diagnosis not present

## 2021-09-19 DIAGNOSIS — F32A Depression, unspecified: Secondary | ICD-10-CM | POA: Diagnosis not present

## 2021-09-19 DIAGNOSIS — E119 Type 2 diabetes mellitus without complications: Secondary | ICD-10-CM | POA: Insufficient documentation

## 2021-09-19 DIAGNOSIS — Z79899 Other long term (current) drug therapy: Secondary | ICD-10-CM | POA: Diagnosis not present

## 2021-09-19 DIAGNOSIS — M25529 Pain in unspecified elbow: Secondary | ICD-10-CM | POA: Diagnosis present

## 2021-09-19 DIAGNOSIS — Z6841 Body Mass Index (BMI) 40.0 and over, adult: Secondary | ICD-10-CM | POA: Insufficient documentation

## 2021-09-19 DIAGNOSIS — F17200 Nicotine dependence, unspecified, uncomplicated: Secondary | ICD-10-CM | POA: Insufficient documentation

## 2021-09-19 HISTORY — PX: GANGLION CYST EXCISION: SHX1691

## 2021-09-19 LAB — GLUCOSE, CAPILLARY
Glucose-Capillary: 112 mg/dL — ABNORMAL HIGH (ref 70–99)
Glucose-Capillary: 93 mg/dL (ref 70–99)

## 2021-09-19 LAB — POCT PREGNANCY, URINE: Preg Test, Ur: NEGATIVE

## 2021-09-19 SURGERY — EXCISION, GANGLION CYST, WRIST
Anesthesia: General | Site: Wrist | Laterality: Right

## 2021-09-19 MED ORDER — CHLORHEXIDINE GLUCONATE 0.12 % MT SOLN
OROMUCOSAL | Status: AC
  Administered 2021-09-19: 15 mL via OROMUCOSAL
  Filled 2021-09-19: qty 15

## 2021-09-19 MED ORDER — ONDANSETRON HCL 4 MG/2ML IJ SOLN
INTRAMUSCULAR | Status: DC | PRN
  Administered 2021-09-19: 4 mg via INTRAVENOUS

## 2021-09-19 MED ORDER — ONDANSETRON HCL 4 MG/2ML IJ SOLN: 4.0000 mg | Freq: Four times a day (QID) | INTRAMUSCULAR | Status: DC | PRN

## 2021-09-19 MED ORDER — PROPOFOL 500 MG/50ML IV EMUL
INTRAVENOUS | Status: DC | PRN
  Administered 2021-09-19: 125 ug/kg/min via INTRAVENOUS

## 2021-09-19 MED ORDER — FENTANYL CITRATE (PF) 100 MCG/2ML IJ SOLN: 25.0000 ug | INTRAMUSCULAR | Status: DC | PRN

## 2021-09-19 MED ORDER — CEFAZOLIN SODIUM-DEXTROSE 2-4 GM/100ML-% IV SOLN
INTRAVENOUS | Status: AC
  Filled 2021-09-19: qty 100

## 2021-09-19 MED ORDER — PROPOFOL 10 MG/ML IV BOLUS
INTRAVENOUS | Status: DC | PRN
Start: 2021-09-19 — End: 2021-09-19
  Administered 2021-09-19: 200 mg via INTRAVENOUS

## 2021-09-19 MED ORDER — PROPOFOL 10 MG/ML IV BOLUS
INTRAVENOUS | Status: AC
  Filled 2021-09-19: qty 20

## 2021-09-19 MED ORDER — HYDROCODONE-ACETAMINOPHEN 5-325 MG PO TABS: 1.0000 | ORAL_TABLET | Freq: Four times a day (QID) | ORAL | 0 refills | Status: DC | PRN

## 2021-09-19 MED ORDER — 0.9 % SODIUM CHLORIDE (POUR BTL) OPTIME
TOPICAL | Status: DC | PRN
  Administered 2021-09-19: 500 mL

## 2021-09-19 MED ORDER — ORAL CARE MOUTH RINSE: 15.0000 mL | Freq: Once | OROMUCOSAL | Status: AC

## 2021-09-19 MED ORDER — MIDAZOLAM HCL 2 MG/2ML IJ SOLN
INTRAMUSCULAR | Status: AC
  Filled 2021-09-19: qty 2

## 2021-09-19 MED ORDER — SODIUM CHLORIDE 0.9 % IV SOLN: INTRAVENOUS | Status: DC

## 2021-09-19 MED ORDER — FENTANYL CITRATE (PF) 100 MCG/2ML IJ SOLN
INTRAMUSCULAR | Status: DC | PRN
  Administered 2021-09-19 (×2): 50 ug via INTRAVENOUS

## 2021-09-19 MED ORDER — ONDANSETRON HCL 4 MG PO TABS: 4.0000 mg | ORAL_TABLET | Freq: Four times a day (QID) | ORAL | Status: DC | PRN

## 2021-09-19 MED ORDER — LIDOCAINE HCL (CARDIAC) PF 100 MG/5ML IV SOSY
PREFILLED_SYRINGE | INTRAVENOUS | Status: DC | PRN
Start: 2021-09-19 — End: 2021-09-19
  Administered 2021-09-19: 100 mg via INTRAVENOUS

## 2021-09-19 MED ORDER — PROMETHAZINE HCL 25 MG/ML IJ SOLN: 6.2500 mg | INTRAMUSCULAR | Status: DC | PRN

## 2021-09-19 MED ORDER — MIDAZOLAM HCL 2 MG/2ML IJ SOLN
INTRAMUSCULAR | Status: DC | PRN
  Administered 2021-09-19: 2 mg via INTRAVENOUS

## 2021-09-19 MED ORDER — METOCLOPRAMIDE HCL 10 MG PO TABS: 5.0000 mg | ORAL_TABLET | Freq: Three times a day (TID) | ORAL | Status: DC | PRN

## 2021-09-19 MED ORDER — CEFAZOLIN SODIUM-DEXTROSE 2-4 GM/100ML-% IV SOLN
2.0000 g | INTRAVENOUS | Status: AC
  Administered 2021-09-19: 2 g via INTRAVENOUS

## 2021-09-19 MED ORDER — CHLORHEXIDINE GLUCONATE 0.12 % MT SOLN: 15.0000 mL | Freq: Once | OROMUCOSAL | Status: AC

## 2021-09-19 MED ORDER — FAMOTIDINE 20 MG PO TABS: 20.0000 mg | ORAL_TABLET | Freq: Once | ORAL | Status: AC

## 2021-09-19 MED ORDER — BUPIVACAINE HCL (PF) 0.5 % IJ SOLN
INTRAMUSCULAR | Status: AC
  Filled 2021-09-19: qty 30

## 2021-09-19 MED ORDER — FAMOTIDINE 20 MG PO TABS
ORAL_TABLET | ORAL | Status: AC
  Administered 2021-09-19: 20 mg via ORAL
  Filled 2021-09-19: qty 1

## 2021-09-19 MED ORDER — KETAMINE HCL 50 MG/5ML IJ SOSY
PREFILLED_SYRINGE | INTRAMUSCULAR | Status: AC
  Filled 2021-09-19: qty 5

## 2021-09-19 MED ORDER — METOCLOPRAMIDE HCL 5 MG/ML IJ SOLN: 5.0000 mg | Freq: Three times a day (TID) | INTRAMUSCULAR | Status: DC | PRN

## 2021-09-19 MED ORDER — KETAMINE HCL 10 MG/ML IJ SOLN
INTRAMUSCULAR | Status: DC | PRN
  Administered 2021-09-19: 30 mg via INTRAVENOUS

## 2021-09-19 MED ORDER — DEXAMETHASONE SODIUM PHOSPHATE 10 MG/ML IJ SOLN
INTRAMUSCULAR | Status: DC | PRN
  Administered 2021-09-19: 5 mg via INTRAVENOUS

## 2021-09-19 MED ORDER — BUPIVACAINE HCL 0.5 % IJ SOLN
INTRAMUSCULAR | Status: DC | PRN
  Administered 2021-09-19: 10 mL

## 2021-09-19 MED ORDER — FENTANYL CITRATE (PF) 100 MCG/2ML IJ SOLN
INTRAMUSCULAR | Status: AC
  Filled 2021-09-19: qty 2

## 2021-09-19 SURGICAL SUPPLY — 40 items
ADH SKN CLS APL DERMABOND .7 (GAUZE/BANDAGES/DRESSINGS) ×1
APL PRP STRL LF DISP 70% ISPRP (MISCELLANEOUS) ×2
BNDG CMPR STD VLCR NS LF 5.8X3 (GAUZE/BANDAGES/DRESSINGS) ×1
BNDG ELASTIC 3X5.8 VLCR NS LF (GAUZE/BANDAGES/DRESSINGS) ×2 IMPLANT
CAST PADDING 3X4FT ST 30246 (SOFTGOODS) ×1
CHLORAPREP W/TINT 26 (MISCELLANEOUS) ×3 IMPLANT
CUFF TOURN SGL QUICK 18X4 (TOURNIQUET CUFF) IMPLANT
DERMABOND ADVANCED (GAUZE/BANDAGES/DRESSINGS) ×1
DERMABOND ADVANCED .7 DNX12 (GAUZE/BANDAGES/DRESSINGS) IMPLANT
DRAPE INCISE 23X17 IOBAN STRL (DRAPES) ×1
DRAPE INCISE 23X17 STRL (DRAPES) IMPLANT
DRAPE INCISE IOBAN 23X17 STRL (DRAPES) ×1 IMPLANT
ELECT CAUTERY NDL 2.0 MIC (NEEDLE) ×1 IMPLANT
ELECT CAUTERY NEEDLE 2.0 MIC (NEEDLE) IMPLANT
GAUZE SPONGE 4X4 12PLY STRL (GAUZE/BANDAGES/DRESSINGS) ×2 IMPLANT
GAUZE XEROFORM 1X8 LF (GAUZE/BANDAGES/DRESSINGS) ×1 IMPLANT
GLOVE SURG SYN 9.0  PF PI (GLOVE) ×1
GLOVE SURG SYN 9.0 PF PI (GLOVE) ×1 IMPLANT
GOWN SRG 2XL LVL 4 RGLN SLV (GOWNS) ×1 IMPLANT
GOWN STRL NON-REIN 2XL LVL4 (GOWNS) ×2
GOWN STRL REUS W/ TWL LRG LVL3 (GOWN DISPOSABLE) ×1 IMPLANT
GOWN STRL REUS W/TWL LRG LVL3 (GOWN DISPOSABLE) ×2
KIT TURNOVER KIT A (KITS) ×2 IMPLANT
MANIFOLD NEPTUNE II (INSTRUMENTS) ×2 IMPLANT
NS IRRIG 500ML POUR BTL (IV SOLUTION) ×2 IMPLANT
PACK EXTREMITY ARMC (MISCELLANEOUS) ×2 IMPLANT
PAD CAST CTTN 3X4 STRL (SOFTGOODS) ×1 IMPLANT
PADDING CAST COTTON 3X4 STRL (SOFTGOODS) ×1
SCALPEL PROTECTED #15 DISP (BLADE) ×4 IMPLANT
SPLINT CAST 1 STEP 3X12 (MISCELLANEOUS) ×2 IMPLANT
SUT ETHILON 4-0 (SUTURE) ×2
SUT ETHILON 4-0 FS2 18XMFL BLK (SUTURE) ×1
SUT ETHILON 5-0 FS-2 18 BLK (SUTURE) ×1 IMPLANT
SUT MNCRL 4-0 (SUTURE)
SUT MNCRL 4-018XMFL (SUTURE)
SUT VIC AB 4-0 P-3 18X BRD (SUTURE) IMPLANT
SUT VIC AB 4-0 P3 18 (SUTURE) ×2
SUTURE ETHLN 4-0 FS2 18XMF BLK (SUTURE) ×1 IMPLANT
SUTURE MNCRL 4-018XMF (SUTURE) ×1 IMPLANT
WATER STERILE IRR 500ML POUR (IV SOLUTION) ×2 IMPLANT

## 2021-09-19 NOTE — Anesthesia Procedure Notes (Signed)
Procedure Name: LMA Insertion Date/Time: 09/19/2021 12:35 PM Performed by: Berniece Pap, CRNA Pre-anesthesia Checklist: Patient identified, Emergency Drugs available, Suction available and Patient being monitored Patient Re-evaluated:Patient Re-evaluated prior to induction Oxygen Delivery Method: Circle system utilized Preoxygenation: Pre-oxygenation with 100% oxygen Induction Type: IV induction Ventilation: Mask ventilation without difficulty LMA Size: 3.5 Tube type: Oral Number of attempts: 1 Placement Confirmation: positive ETCO2 and breath sounds checked- equal and bilateral Tube secured with: Tape Dental Injury: Teeth and Oropharynx as per pre-operative assessment

## 2021-09-19 NOTE — H&P (Signed)
Chief Complaint  Patient presents with   Elbow Pain  Right wrist cyst    History of the Present Illness: Allison Austin is a 26 y.o. female here today.   The patient presents for evaluation of a right wrist cyst. This is a visit with x-ray. The patient reports she is doing well.  The patient states the cyst started in the spring of 2022. She noticed a small pea under her skin, and she thought she had eczema. It started getting bigger in 03/2021. She made the appointment because she noticed it kept getting bigger. Last week, she started waking up in the morning with soreness, and she thought she was sleeping weird. She would wake up and her right hand would be numb. It does not hurt to touch, but if she moves her hand back, it starts to pinch. She has been wearing a wrist brace.   The patient is right-hand dominant. She had a ganglion cyst when she was a baby.   The patient is employed as a Building surveyor. She recently started a class at [facility] online, which involves typing.  I have reviewed past medical, surgical, social and family history, and allergies as documented in the EMR.  Past Medical History: Past Medical History:  Diagnosis Date   ADHD (attention deficit hyperactivity disorder)   Anxiety   Depression   Diabetes mellitus type 2, uncomplicated (CMS-HCC)   Past Surgical History: Past Surgical History:  Procedure Laterality Date   COLONOSCOPY 03/11/2019  Negative colon biopsies/Repeat at age 23 in 37yrs/TKT   EGD 03/11/2019  Normal EGD/No Repeat/TKT   TONSILLECTOMY  prior to age 17   TOOTH EXTRACTION   WRIST GANGLION EXCISION Left  prior to age 36   Past Family History: Family History  Adopted: Yes  Problem Relation Age of Onset   Diabetes type II Maternal Grandmother   Thyroid disease Maternal Grandmother   Bipolar disorder Mother   Autoimmune disease Mother  AIDS - passed away 49   Myocardial Infarction (Heart attack) Maternal Grandfather    Medications: Current Outpatient Medications Ordered in Epic  Medication Sig Dispense Refill   citalopram (CELEXA) 20 MG tablet Take 2 tablets (40 mg total) by mouth once daily 180 tablet 1   ergocalciferol, vitamin D2, 1,250 mcg (50,000 unit) capsule Take 1 capsule (50,000 Units total) by mouth once a week for 56 days 8 capsule 0   etonogestrel (NEXPLANON) 68 mg implant Inject 1 each into the skin once.   No current Epic-ordered facility-administered medications on file.   Allergies: No Known Allergies   Body mass index is 47.01 kg/m.  Review of Systems: A comprehensive 14 point ROS was performed, reviewed, and the pertinent orthopaedic findings are documented in the HPI.  Vitals:  09/09/21 0844  BP: 136/80    General Physical Examination:   General/Constitutional: No apparent distress: well-nourished and well developed. Eyes: Pupils equal, round with synchronous movement. Lungs: Clear to auscultation HEENT: Normal Vascular: No edema, swelling or tenderness, except as noted in detailed exam. Cardiac: Heart rate and rhythm is regular. Integumentary: No impressive skin lesions present, except as noted in detailed exam. Neuro/Psych: Normal mood and affect, oriented to person, place and time.  On exam, pain with extension of the right wrist about 1.5 cm mass at the radiocarpal joint. Negative Tinel sign on the right. Mild pain with right wrist flexion. Scar to the dorsal aspect of the right wrist.  Radiographs:  AP and lateral x-rays of the right wrist were ordered  and personally reviewed today. These show no evidence of degenerative change, congenital anomaly, or evidence of carpal instability.  X-ray Impression Normal right wrist.  Assessment: ICD-10-CM  1. Ganglion cyst of dorsum of right wrist M67.431  2. Right wrist pain M25.531   Plan:  The patient has clinical findings of right dorsal wrist ganglion cyst.  We discussed the patient's x-ray findings. I  explained she has a right dorsal wrist ganglion cyst. I recommend right dorsal wrist cyst excision. I explained the surgery and postoperative course in detail.  We will schedule the patient for surgery in the near future.  Surgical Risks:  The nature of the condition and the proposed procedure has been reviewed in detail with the patient. Surgical versus non-surgical options and prognosis for recovery have been reviewed and the inherent risks and benefits of each have been discussed including the risks of infection, bleeding, injury to nerves/blood vessels/tendons, incomplete relief of symptoms, persisting pain and/or stiffness, loss of function, complex regional pain syndrome, failure of the procedure, as appropriate.  Teeth: normal  I, T. Arlana Lindau, Quality Documentation Specialist, completed documentation using DAX technology.  Electronically signed by Marlena Clipper, MD at 09/09/2021 6:59 PM EST  Reviewed  H+P. No changes noted.

## 2021-09-19 NOTE — Discharge Instructions (Addendum)
Keep arm elevated is much as possible through the weekend. Work on finger range of motion but do not try to move wrist Pain medicine as directed Keep dressing clean and dry through the weekend.  AMBULATORY SURGERY  DISCHARGE INSTRUCTIONS   The drugs that you were given will stay in your system until tomorrow so for the next 24 hours you should not:  Drive an automobile Make any legal decisions Drink any alcoholic beverage   You may resume regular meals tomorrow.  Today it is better to start with liquids and gradually work up to solid foods.  You may eat anything you prefer, but it is better to start with liquids, then soup and crackers, and gradually work up to solid foods.   Please notify your doctor immediately if you have any unusual bleeding, trouble breathing, redness and pain at the surgery site, drainage, fever, or pain not relieved by medication.    Additional Instructions:        Please contact your physician with any problems or Same Day Surgery at [phone], Monday through Friday 6 am to 4 pm, or [place] [facility] number at [phone].

## 2021-09-19 NOTE — Transfer of Care (Signed)
Immediate Anesthesia Transfer of Care Note  Patient: Allison Austin  Procedure(s) Performed: Right dorsal wrist ganglion cyst excision (Right: Wrist)  Patient Location: PACU  Anesthesia Type:General  Level of Consciousness: awake  Airway & Oxygen Therapy: Patient Spontanous Breathing and Patient connected to face mask oxygen  Post-op Assessment: Report given to RN and Post -op Vital signs reviewed and stable  Post vital signs: Reviewed and stable  Last Vitals:  Vitals Value Taken Time  BP 98/61 09/19/21 1309  Temp    Pulse 113 09/19/21 1314  Resp 19 09/19/21 1314  SpO2 97 % 09/19/21 1314  Vitals shown include unvalidated device data.  Last Pain:  Vitals:   09/19/21 1307  TempSrc:   PainSc: Asleep         Complications: No notable events documented.

## 2021-09-19 NOTE — Anesthesia Preprocedure Evaluation (Signed)
Anesthesia Evaluation  Patient identified by MRN, date of birth, ID band Patient awake    Reviewed: Allergy & Precautions, NPO status , Patient's Chart, lab work & pertinent test results  History of Anesthesia Complications Negative for: history of anesthetic complications  Airway Mallampati: III       Dental  (+) Dental Advidsory Given, Teeth Intact, Chipped   Pulmonary neg shortness of breath, neg COPD, neg recent URI, Current Smoker and Patient abstained from smoking.,           Cardiovascular negative cardio ROS       Neuro/Psych PSYCHIATRIC DISORDERS Anxiety Depression negative neurological ROS     GI/Hepatic Neg liver ROS, GERD  Controlled,  Endo/Other  diabetes, Oral Hypoglycemic AgentsMorbid obesity  Renal/GU negative Renal ROS     Musculoskeletal   Abdominal   Peds  Hematology negative hematology ROS (+)   Anesthesia Other Findings Past Medical History: No date: ADHD (attention deficit hyperactivity disorder) No date: Adopted No date: Anxiety No date: Back pain No date: Depression 2019: Diabetes mellitus without complication (HCC) No date: Frequent headaches No date: GERD (gastroesophageal reflux disease)     Comment:  OCC- NO MEDS No date: Pre-diabetes   Reproductive/Obstetrics                             Anesthesia Physical  Anesthesia Plan  ASA: 3  Anesthesia Plan: General   Post-op Pain Management:    Induction: Intravenous  PONV Risk Score and Plan: 2 and Ondansetron, Dexamethasone, Midazolam, Treatment may vary due to age or medical condition and Promethazine  Airway Management Planned: LMA and Oral ETT  Additional Equipment:   Intra-op Plan:   Post-operative Plan: Extubation in OR  Informed Consent: I have reviewed the patients History and Physical, chart, labs and discussed the procedure including the risks, benefits and alternatives for the  proposed anesthesia with the patient or authorized representative who has indicated his/her understanding and acceptance.       Plan Discussed with:   Anesthesia Plan Comments:         Anesthesia Quick Evaluation

## 2021-09-19 NOTE — Anesthesia Postprocedure Evaluation (Signed)
Anesthesia Post Note  Patient: Allison Austin  Procedure(s) Performed: Right dorsal wrist ganglion cyst excision (Right: Wrist)  Patient location during evaluation: PACU Anesthesia Type: General Level of consciousness: awake and alert Pain management: pain level controlled Vital Signs Assessment: post-procedure vital signs reviewed and stable Respiratory status: spontaneous breathing, nonlabored ventilation, respiratory function stable and patient connected to nasal cannula oxygen Cardiovascular status: blood pressure returned to baseline and stable Postop Assessment: no apparent nausea or vomiting Anesthetic complications: no   No notable events documented.   Last Vitals:  Vitals:   09/19/21 1400 09/19/21 1410  BP:  113/79  Pulse:  67  Resp:  14  Temp: (!) 36.2 C (!) 36.1 C  SpO2:  100%    Last Pain:  Vitals:   09/19/21 1410  TempSrc: Temporal  PainSc:                  Lenard Simmer

## 2021-09-19 NOTE — Op Note (Signed)
09/19/2021  1:11 PM  PATIENT:  Allison Austin  26 y.o. female  PRE-OPERATIVE DIAGNOSIS:  Ganglion cyst of dorsum of right wrist  M67.431 Right wrist pain M25.531  POST-OPERATIVE DIAGNOSIS:  Ganglion cyst of dorsum of right wrist  M67.431  PROCEDURE:  Procedure(s): Right dorsal wrist ganglion cyst excision (Right)  SURGEON: Leitha Schuller, MD  ASSISTANTS: None  ANESTHESIA:   general  EBL:  Total I/O In: 600 [I.V.:500; IV Piggyback:100] Out: -   BLOOD ADMINISTERED:none  DRAINS: none   LOCAL MEDICATIONS USED:  MARCAINE     SPECIMEN:  No Specimen  DISPOSITION OF SPECIMEN:  N/A  COUNTS:  YES  TOURNIQUET:   Total Tourniquet Time Documented: Upper Arm (Right) - 14 minutes Total: Upper Arm (Right) - 14 minutes   IMPLANTS: None  DICTATION: .Dragon Dictation patient was brought to the operating room and after adequate anesthesia was obtained the right wrist was prepped and draped in the usual sterile fashion.  After patient identification and timeout procedures were completed tourniquet was raised and incision was made directly over the ganglion on the dorsum of the wrist approximately 2 cm incision.  An incision down through the skin and then with soft tissue spreading the ganglion popped up through the wound this was tracked down to the radiocarpal joint and cut off at its base.  The cyst base was then cauterized and abraded with use of a Freer elevator to try because scar tissue and prevent recurrence.  The wound was irrigated and injected with 10 cc of half percent Sensorcaine closed with 4-0 Vicryl in a subcuticular closure followed by Dermabond.  After the Dermabond had dried Telfa 4 x 4 web roll and a volar splint applied with an Ace wrap and tourniquet let down patient taught procedure well there are no complications and no specimen  PLAN OF CARE: Discharge to home after PACU  PATIENT DISPOSITION:  PACU - hemodynamically stable.

## 2021-09-20 ENCOUNTER — Encounter: Payer: Self-pay | Admitting: Orthopedic Surgery

## 2022-01-02 DIAGNOSIS — E559 Vitamin D deficiency, unspecified: Secondary | ICD-10-CM | POA: Insufficient documentation

## 2022-04-03 DIAGNOSIS — E1169 Type 2 diabetes mellitus with other specified complication: Secondary | ICD-10-CM | POA: Insufficient documentation

## 2022-04-29 ENCOUNTER — Encounter: Payer: Self-pay | Admitting: Advanced Practice Midwife

## 2022-04-29 ENCOUNTER — Ambulatory Visit (INDEPENDENT_AMBULATORY_CARE_PROVIDER_SITE_OTHER): Payer: 59 | Admitting: Advanced Practice Midwife

## 2022-04-29 VITALS — BP 118/70 | HR 82 | Ht 62.0 in | Wt 257.0 lb

## 2022-04-29 DIAGNOSIS — L731 Pseudofolliculitis barbae: Secondary | ICD-10-CM

## 2022-04-29 MED ORDER — CEPHALEXIN 500 MG PO CAPS: 500.0000 mg | ORAL_CAPSULE | Freq: Four times a day (QID) | ORAL | 0 refills | Status: DC

## 2022-04-30 ENCOUNTER — Encounter: Payer: Self-pay | Admitting: Advanced Practice Midwife

## 2022-04-30 NOTE — Patient Instructions (Signed)
Ingrown Hair  An ingrown hair is a hair that curls and re-enters the skin instead of growing straight out of the skin. An ingrown hair can develop in any part of the skin that hair is removed from. An ingrown hair may cause small pockets of infection. What are the causes? An ingrown hair may be caused by: Shaving. Tweezing. Waxing. Using a hair removal cream. What increases the risk? You are more likely to develop this condition if you have curly hair. What are the signs or symptoms? Symptoms of an ingrown hair may include: Small bumps on the skin. The bumps may be filled with pus. Pain. Itching. How is this diagnosed? An ingrown hair is diagnosed by a skin exam. How is this treated? Treatment is often not needed unless the ingrown hair has caused an infection. If needed, treatment may include: Applying prescription creams to the skin. This can help with inflammation. Applying warm compresses to the skin. This can help soften the skin. Taking antibiotic medicine. An antibiotic may be prescribed if the infection is severe. Retracting and releasing the ingrown hair tips. Hair removal by electrolysis or laser. Follow these instructions at home: Medicines Take, apply, or use over-the-counter and prescription medicines only as told by your health care provider. This includes any prescription creams. If you were prescribed an antibiotic medicine, take it as told by your health care provider. Do not stop using the antibiotic even if you start to feel better. General instructions Do not shave irritated areas of skin. You may start shaving these areas again once the irritation has gone away. To help remove ingrown hairs on your face, you may use a facial sponge in a gentle circular motion. Do not pick or squeeze the irritated area of skin as this may cause infection and scarring. Use a hair removal cream as told by your health care provider. Managing pain and swelling  If directed, apply  heat to the affected area as often as told by your health care provider. Use the heat source that your health care provider recommends, such as a moist heat pack or a heating pad. Place a towel between your skin and the heat source. Leave the heat on for 20-30 minutes. If your skin turns bright red, remove the heat right away to prevent burns. The risk of burns is higher if you cannot feel pain, heat, or cold. How is this prevented? Shower before shaving. Wrap areas that you are going to shave in warm, moist wraps for several minutes before shaving. The warmth and moisture help to soften the hairs and makes ingrown hairs less likely. Use thick shaving gels. Use a razor that cuts hair slightly above your skin, or use an electric shaver with a long shave setting. Shave in the direction of hair growth. Avoid making multiple razor strokes. Apply a moisturizing lotion after shaving. Summary An ingrown hair is a hair that curls and re-enters the skin instead of growing straight out of the skin. Treatment is often not needed unless the ingrown hair has caused an infection. Take, apply, or use over-the-counter and prescription medicines only as told by your health care provider. This includes any prescription creams. Do not shave irritated areas of skin. You may start shaving these areas again once the irritation has gone away. If directed, apply heat to the affected area. Use the heat source that your health care provider recommends, such as a moist heat pack or a heating pad. This information is not intended to replace   advice given to you by your health care provider. Make sure you discuss any questions you have with your health care provider. Document Revised: 09/18/2021 Document Reviewed: 09/18/2021 Elsevier Patient Education  2023 [employer]  

## 2022-04-30 NOTE — Progress Notes (Signed)
Patient ID: Allison Austin, female   DOB: 09-01-1995, 26 y.o.   MRN: [id number]  Reason for Consult: Gynecologic Exam (Ingrown hair pubic area. Started as a knot in late June after a wax.)   Subjective:  Date of Service: 04/29/2022  HPI:  Allison Austin is a 26 y.o. female being seen for evaluation of ingrown hair. She had a pubic area wax treatment in June and shortly after developed what she assumed was an ingrown hair. The area started as one bump, red, warm, tender. The bump elongated and continues to be raised and tender. She tried to get the hair out one time, however, the area became more sore following the attempt. She tried warm compress one time for a short time.   Past Medical History:  Diagnosis Date   ADHD (attention deficit hyperactivity disorder)    Adopted    Anxiety    Back pain    Depression    Diabetes mellitus without complication ([place]) 0093   Frequent headaches    GERD (gastroesophageal reflux disease)    OCC- NO MEDS   Pre-diabetes    Family History  Adopted: Yes  Problem Relation Age of Onset   Testicular cancer Maternal Uncle    Diabetes Maternal Grandmother    Uterine cancer Maternal Grandmother    Past Surgical History:  Procedure Laterality Date   COLONOSCOPY  03/2019   GANGLION CYST EXCISION     GANGLION CYST EXCISION Right 09/19/2021   Procedure: Right dorsal wrist ganglion cyst excision;  Surgeon: Hessie Knows, MD;  Location: [facility] ORS;  Service: Orthopedics;  Laterality: Right;   PILONIDAL CYST EXCISION N/A 04/17/2016   Procedure: CYST EXCISION PILONIDAL EXTENSIVE;  Surgeon: Leonie Green, MD;  Location: [facility] ORS;  Service: General;  Laterality: N/A;   TONSILLECTOMY     WISDOM TOOTH EXTRACTION      Short Social History:  Social History   Tobacco Use   Smoking status: Former    Types: E-cigarettes    Quit date: 02/08/2022    Years since quitting: 0.2   Smokeless tobacco: Never   Tobacco comments:    vape 1x every other day   Substance Use Topics   Alcohol use: No    Alcohol/week: 0.0 standard drinks of alcohol    No Known Allergies  Current Outpatient Medications  Medication Sig Dispense Refill   cephALEXin (KEFLEX) 500 MG capsule Take 1 capsule (500 mg total) by mouth 4 (four) times daily. 28 capsule 0   citalopram (CELEXA) 20 MG tablet Take 20 mg by mouth daily.     etonogestrel (NEXPLANON) 68 MG IMPL implant 68 mg by Subdermal route once.     MOUNJARO 5 MG/0.5ML Pen Inject into the skin once a week.     Multiple Vitamins-Minerals (AIRBORNE PO) Take 1 tablet by mouth daily as needed (cold symptoms).     triamcinolone cream (KENALOG) 0.1 % Apply 1 application topically daily as needed (eczema).     Vitamin D, Ergocalciferol, (DRISDOL) 1.25 MG (50000 UNIT) CAPS capsule Take 50,000 Units by mouth once a week.     No current facility-administered medications for this visit.    Review of Systems  Constitutional:  Negative for chills and fever.  HENT:  Negative for congestion, ear discharge, ear pain, hearing loss, sinus pain and sore throat.   Eyes:  Negative for blurred vision and double vision.  Respiratory:  Negative for cough, shortness of breath and wheezing.   Cardiovascular:  Negative for chest pain, palpitations  and leg swelling.  Gastrointestinal:  Negative for abdominal pain, blood in stool, constipation, diarrhea, heartburn, melena, nausea and vomiting.  Genitourinary:  Negative for dysuria, flank pain, frequency, hematuria and urgency.       Positive for ingrown pubic hair  Musculoskeletal:  Negative for back pain, joint pain and myalgias.  Skin:  Negative for itching and rash.  Neurological:  Negative for dizziness, tingling, tremors, sensory change, speech change, focal weakness, seizures, loss of consciousness, weakness and headaches.  Endo/Heme/Allergies:  Negative for environmental allergies. Does not bruise/bleed easily.  Psychiatric/Behavioral:  Negative for depression, hallucinations,  memory loss, substance abuse and suicidal ideas. The patient is not nervous/anxious and does not have insomnia.         Objective:  Objective   Vitals:   04/29/22 1529  BP: 118/70  Pulse: 82  Weight: 257 lb (116.6 kg)  Height: 5\' 2"  (1.575 m)   Body mass index is 47.01 kg/m. Constitutional: Well nourished, well developed female in no acute distress.  HEENT: normal Skin: Warm and dry.  Respiratory:  Normal respiratory effort Psych: Alert and Oriented x3. No memory deficits. Normal mood and affect.    Pelvic exam: (female chaperone present) is not limited by body habitus EGBUS: approximate 1 cm x 3 cm inflamed area right side of Mons Pubis that appears to be several ingrown hairs in a row. Tender and firm to palpation approximately 0.5 cm deep. Patient consented for attempt to lance the area. The area was prepped with alcohol wipe, lidocaine injection, betadine swabs x 3. Very small 2-3 mm incisions were made in the 3 affected hair follicles. Only sanguinous drainage appreciated. Hemostatic with pressure and steri strips applied. Patient was instructed to use warm compresses 20-30 minutes at a time, several times per day. Rx Keflex sent due to persistent nature of area of inflammation.    Assessment/Plan:     26 y.o. G0 P0 female with several ingrown hairs persisting x 3 months without resolution  Apply warm compresses as instructed Rx Keflex QID x 7 days Follow up as needed   [facility] 04/30/2022, 1:07 PM

## 2023-10-27 ENCOUNTER — Emergency Department

## 2023-10-27 ENCOUNTER — Other Ambulatory Visit: Payer: Self-pay

## 2023-10-27 ENCOUNTER — Emergency Department
Admission: EM | Admit: 2023-10-27 | Discharge: 2023-10-27 | Disposition: A | Discharge status: Home / Self Care | Attending: Emergency Medicine | Admitting: Emergency Medicine

## 2023-10-27 ENCOUNTER — Encounter: Payer: Self-pay | Admitting: Emergency Medicine

## 2023-10-27 DIAGNOSIS — R109 Unspecified abdominal pain: Secondary | ICD-10-CM | POA: Diagnosis not present

## 2023-10-27 DIAGNOSIS — R112 Nausea with vomiting, unspecified: Secondary | ICD-10-CM | POA: Diagnosis present

## 2023-10-27 DIAGNOSIS — R1084 Generalized abdominal pain: Secondary | ICD-10-CM

## 2023-10-27 DIAGNOSIS — E119 Type 2 diabetes mellitus without complications: Secondary | ICD-10-CM | POA: Insufficient documentation

## 2023-10-27 DIAGNOSIS — D72829 Elevated white blood cell count, unspecified: Secondary | ICD-10-CM | POA: Diagnosis not present

## 2023-10-27 LAB — RESP PANEL BY RT-PCR (RSV, FLU A&B, COVID)  RVPGX2
Influenza A by PCR: NEGATIVE
Influenza B by PCR: NEGATIVE
Resp Syncytial Virus by PCR: NEGATIVE
SARS Coronavirus 2 by RT PCR: NEGATIVE

## 2023-10-27 LAB — LIPASE, BLOOD: Lipase: 29 U/L (ref 11–51)

## 2023-10-27 LAB — CBC
HCT: 42.7 % (ref 36.0–46.0)
Hemoglobin: 15.2 g/dL — ABNORMAL HIGH (ref 12.0–15.0)
MCH: 30.9 pg (ref 26.0–34.0)
MCHC: 35.6 g/dL (ref 30.0–36.0)
MCV: 86.8 fL (ref 80.0–100.0)
Platelets: 323 10*3/uL (ref 150–400)
RBC: 4.92 MIL/uL (ref 3.87–5.11)
RDW: 12.3 % (ref 11.5–15.5)
WBC: 20.1 10*3/uL — ABNORMAL HIGH (ref 4.0–10.5)
nRBC: 0 % (ref 0.0–0.2)

## 2023-10-27 LAB — COMPREHENSIVE METABOLIC PANEL
ALT: 20 U/L (ref 0–44)
AST: 26 U/L (ref 15–41)
Albumin: 4.3 g/dL (ref 3.5–5.0)
Alkaline Phosphatase: 60 U/L (ref 38–126)
Anion gap: 9 (ref 5–15)
BUN: 15 mg/dL (ref 6–20)
CO2: 18 mmol/L — ABNORMAL LOW (ref 22–32)
Calcium: 9.4 mg/dL (ref 8.9–10.3)
Chloride: 109 mmol/L (ref 98–111)
Creatinine, Ser: 0.65 mg/dL (ref 0.44–1.00)
GFR, Estimated: >60 mL/min (ref >60)
Glucose, Bld: 163 mg/dL — ABNORMAL HIGH (ref 70–99)
Potassium: 3.8 mmol/L (ref 3.5–5.1)
Sodium: 136 mmol/L (ref 135–145)
Total Bilirubin: 0.8 mg/dL (ref 0.0–1.2)
Total Protein: 8.1 g/dL (ref 6.5–8.1)

## 2023-10-27 LAB — URINALYSIS, ROUTINE W REFLEX MICROSCOPIC
Bilirubin Urine: NEGATIVE
Glucose, UA: NEGATIVE mg/dL
Hgb urine dipstick: NEGATIVE
Ketones, ur: NEGATIVE mg/dL
Leukocytes,Ua: NEGATIVE
Nitrite: NEGATIVE
Protein, ur: NEGATIVE mg/dL
Specific Gravity, Urine: 1.044 — ABNORMAL HIGH (ref 1.005–1.030)
pH: 6 (ref 5.0–8.0)

## 2023-10-27 LAB — TROPONIN I (HIGH SENSITIVITY)
Troponin I (High Sensitivity): <2 ng/L (ref <18)
Troponin I (High Sensitivity): <2 ng/L (ref <18)

## 2023-10-27 LAB — HCG, QUANTITATIVE, PREGNANCY: hCG, Beta Chain, Quant, S: <1 m[IU]/mL (ref <5)

## 2023-10-27 MED ORDER — LACTATED RINGERS IV BOLUS
1000.0000 mL | Freq: Once | INTRAVENOUS | Status: AC
  Administered 2023-10-27: 1000 mL via INTRAVENOUS

## 2023-10-27 MED ORDER — IOHEXOL 300 MG/ML  SOLN
100.0000 mL | Freq: Once | INTRAMUSCULAR | Status: AC | PRN
  Administered 2023-10-27: 100 mL via INTRAVENOUS

## 2023-10-27 MED ORDER — ONDANSETRON 4 MG PO TBDP: 4.0000 mg | ORAL_TABLET | Freq: Three times a day (TID) | ORAL | 0 refills | Status: DC | PRN

## 2023-10-27 MED ORDER — ONDANSETRON 4 MG PO TBDP
4.0000 mg | ORAL_TABLET | Freq: Once | ORAL | Status: AC | PRN
  Administered 2023-10-27: 4 mg via ORAL
  Filled 2023-10-27: qty 1

## 2023-10-27 MED ORDER — DROPERIDOL 2.5 MG/ML IJ SOLN
1.2500 mg | Freq: Once | INTRAMUSCULAR | Status: AC
  Administered 2023-10-27: 1.25 mg via INTRAVENOUS
  Filled 2023-10-27: qty 2

## 2023-10-27 NOTE — ED Triage Notes (Signed)
 Pt to ED via POV. Pt states that she has been vomiting since 2130 last PM. Pt states that she constantly feels nauseated. Pt has a soda she is drinking, pt states that she has been unable to keep any of it down. Pt advised not to drink anymore of it. Pt states that she is also having diarrhea. Pt is in NAD.

## 2023-10-27 NOTE — Discharge Instructions (Addendum)
 Please stop taking the THC vape.  You can take 4 mg of Zofran every 8 hours as needed for nausea.

## 2023-10-27 NOTE — ED Provider Notes (Signed)
 [facility] Provider Note    Event Date/Time   First MD Initiated Contact with Patient 10/27/23 [phone]     (approximate)   History   Emesis   HPI  Allison Austin is a 28 y.o. female with history of diabetes, ADHD, anxiety, GERD, presenting with nausea vomiting since Thursday.  Also with diarrhea.  States that she felt little bit short of breath after multiple episodes of vomiting today.  No shortness of breath or chest pain right now.  States that she noticed a little bit of blood in her vomitus after a long episode of vomiting today.  States that she has diffuse abdominal tenderness.  Does vape THC daily.  No fevers, no urinary symptoms.  No back pain.  On independent review, she was seen by her primary care doctor in January of this year, has history of diabetes, A1c has been 5.5%.  She also has GAD on Celexa 40 mg daily.     Physical Exam   Triage Vital Signs: ED Triage Vitals  Encounter Vitals Group     BP 10/27/23 0815 (!) 103/58     Systolic BP Percentile --      Diastolic BP Percentile --      Pulse Rate 10/27/23 0815 70     Resp 10/27/23 0815 18     Temp 10/27/23 0815 97.8 F (36.6 C)     Temp Source 10/27/23 0815 Oral     SpO2 10/27/23 0815 97 %     Weight 10/27/23 0812 230 lb (104.3 kg)     Height 10/27/23 0812 5\' 2"  (1.575 m)     Head Circumference --      Peak Flow --      Pain Score 10/27/23 0812 0     Pain Loc --      Pain Education --      Exclude from Growth Chart --     Most recent vital signs: Vitals:   10/27/23 0815 10/27/23 1135  BP: (!) 103/58 116/67  Pulse: 70 75  Resp: 18 20  Temp: 97.8 F (36.6 C)   SpO2: 97% 98%     General: Awake, no distress.  CV:  Good peripheral perfusion.  Resp:  Normal effort.  No tachypnea or work of breathing Abd:  No distention.  Abdomen soft, mild diffuse tenderness no guarding Other:  Moving all 4 extremities without focal weakness  ED Results / Procedures / Treatments    Labs (all labs ordered are listed, but only abnormal results are displayed) Labs Reviewed  COMPREHENSIVE METABOLIC PANEL - Abnormal; Notable for the following components:      Result Value   CO2 18 (*)    Glucose, Bld 163 (*)    All other components within normal limits  CBC - Abnormal; Notable for the following components:   WBC 20.1 (*)    Hemoglobin 15.2 (*)    All other components within normal limits  URINALYSIS, ROUTINE W REFLEX MICROSCOPIC - Abnormal; Notable for the following components:   Color, Urine YELLOW (*)    APPearance HAZY (*)    Specific Gravity, Urine 1.044 (*)    All other components within normal limits  RESP PANEL BY RT-PCR (RSV, FLU A&B, COVID)  RVPGX2  LIPASE, BLOOD  HCG, QUANTITATIVE, PREGNANCY  CBG MONITORING, ED  TROPONIN I (HIGH SENSITIVITY)  TROPONIN I (HIGH SENSITIVITY)     EKG  Rhythm with sinus arrhythmia, rate 79, normal QRS, normal QTc, T wave flattening  in aVL, V2, no ischemic ST elevation, T wave change in V2 is new compared to prior   RADIOLOGY CT abdomen pelvis on my interpretation without obvious free air   PROCEDURES:  Critical Care performed: No  Procedures   MEDICATIONS ORDERED IN ED: Medications  ondansetron (ZOFRAN-ODT) disintegrating tablet 4 mg (4 mg Oral Given 10/27/23 0821)  lactated ringers bolus 1,000 mL (1,000 mLs Intravenous New Bag/Given 10/27/23 0838)  droperidol (INAPSINE) 2.5 MG/ML injection 1.25 mg (1.25 mg Intravenous Given 10/27/23 0849)  iohexol (OMNIPAQUE) 300 MG/ML solution 100 mL (100 mLs Intravenous Contrast Given 10/27/23 1038)     IMPRESSION / MDM / ASSESSMENT AND PLAN / ED COURSE  I reviewed the triage vital signs and the nursing notes.                              Differential diagnosis includes, but is not limited to, gastroenteritis, food poisoning, marijuana side effects, colitis, norovirus, viral illness, DKA, cyclic vomiting syndrome, for the small blood in her vomitus after persistent  vomiting, considered Mallory-Weiss tear, considered Boerhaave's but patient is nontoxic-appearing, no chest pain or shortness of breath at this time.  Patient's brief shortness of breath after episode of vomiting, considered but doubt ACS, PE, pneumonia.  Will get labs, EKG, troponin, chest x-ray, CT abdomen pelvis, IV fluids, she was given some Zofran out of triage, has persistent nausea vomiting.  Given her history of THC use, will give her some droperidol.  Reassess.  Patient's presentation is most consistent with acute presentation with potential threat to life or bodily function.  Independent review of labs and imaging are below.  Patient is feeling a lot better, tolerating p.o.  Shared decision making done with patient and she is agreeable plan for discharge, recommended stopping her THC vape.  Will give her some Zofran outpatient.  Instructed her to follow-up with her primary care doctor for further management of her symptoms.  Considered but no indication for inpatient admission at this time, she is safe for outpatient management.  Strict return precautions given.  Discharge.  Clinical Course as of 10/27/23 1215  Tue Oct 27, 2023  1107 Independent review of labs, electrolytes not severely deranged, LFTs are normal, she has leukocytosis, beta quant is negative, lipase is normal, respiratory viral panel is negative.  Troponin is negative. [TT]  1150 On reassessment patient was significantly better after she got the droperidol.  Discussed with patient about laboratory results as well as cessation of THC. [TT]  1211 UA not consistent with UTI. [TT]    Clinical Course User Index [TT] Claybon Jabs, MD     FINAL CLINICAL IMPRESSION(S) / ED DIAGNOSES   Final diagnoses:  Nausea vomiting and diarrhea  Generalized abdominal pain     Rx / DC Orders   ED Discharge Orders          Ordered    ondansetron (ZOFRAN-ODT) 4 MG disintegrating tablet  Every 8 hours PRN        10/27/23 1212              Note:  This document was prepared using Dragon voice recognition software and may include unintentional dictation errors.    Claybon Jabs, MD 10/27/23 [phone]

## 2023-12-28 ENCOUNTER — Encounter: Payer: Self-pay | Admitting: Emergency Medicine

## 2023-12-28 ENCOUNTER — Ambulatory Visit
Admission: EM | Admit: 2023-12-28 | Discharge: 2023-12-28 | Disposition: A | Discharge status: Home / Self Care | Attending: Emergency Medicine | Admitting: Emergency Medicine

## 2023-12-28 DIAGNOSIS — J029 Acute pharyngitis, unspecified: Secondary | ICD-10-CM | POA: Diagnosis present

## 2023-12-28 DIAGNOSIS — B349 Viral infection, unspecified: Secondary | ICD-10-CM | POA: Insufficient documentation

## 2023-12-28 LAB — POCT RAPID STREP A (OFFICE): Rapid Strep A Screen: NEGATIVE

## 2023-12-28 MED ORDER — LIDOCAINE VISCOUS HCL 2 % MT SOLN: 15.0000 mL | OROMUCOSAL | 0 refills | Status: DC | PRN

## 2023-12-28 MED ORDER — PREDNISONE 20 MG PO TABS
40.0000 mg | ORAL_TABLET | Freq: Every day | ORAL | 0 refills | Status: DC
Start: 2023-12-28 — End: 2024-01-19

## 2023-12-28 NOTE — ED Triage Notes (Signed)
 Saturday night throat started hurting. Pain gotten worse over past 2 days. Took tylenol  yesterday. Feels like swallowing glass and lymph nodes are swollen.

## 2023-12-28 NOTE — Discharge Instructions (Addendum)
 Your symptoms today are most likely being caused by a virus and should steadily improve in time it can take up to 7 to 10 days before you truly start to see a turnaround however things will get better  Rapid strep test is negative, has been sent to the lab to see if bacteria will grow, if this occurs you will be notified and antibiotic started  Begin prednisone  every morning with food to help reduce inflammation pain, may take Tylenol  in addition  May gargle and spit lidocaine  solution as needed for temporary relief to your throat   For cough: honey 1/2 to 1 teaspoon (you can dilute the honey in water or another fluid).  You can also use guaifenesin and dextromethorphan for cough. You can use a humidifier for chest congestion and cough.  If you don't have a humidifier, you can sit in the bathroom with the hot shower running.      For sore throat: try warm salt water gargles, cepacol lozenges, throat spray, warm tea or water with lemon/honey, popsicles or ice, or OTC cold relief medicine for throat discomfort.   For congestion: take a daily anti-histamine like Zyrtec, Claritin, and a oral decongestant, such as pseudoephedrine.  You can also use Flonase 1-2 sprays in each nostril daily.   It is important to stay hydrated: drink plenty of fluids (water, gatorade/powerade/pedialyte, juices, or teas) to keep your throat moisturized and help further relieve irritation/discomfort.

## 2023-12-28 NOTE — ED Provider Notes (Signed)
 Arlander Bellman    CSN: [id number] Arrival date & time: 12/28/23  1050      History   Chief Complaint Chief Complaint  Patient presents with   Sore Throat    HPI Allison Austin is a 28 y.o. female.   Patient presents for evaluation of subjective fever, chills, sore throat and swollen lymph nodes beginning 2 days ago.  Has noticed some congestion today but unsure if related to her crying.  Painful to swallow, feels like glass but able to tolerate food and liquids.  Has taken Tylenol .  No known sick contacts prior.  Past Medical History:  Diagnosis Date   ADHD (attention deficit hyperactivity disorder)    Adopted    Anxiety    Back pain    Depression    Diabetes mellitus without complication ([facility]) 2019   Frequent headaches    GERD (gastroesophageal reflux disease)    OCC- NO MEDS   Pre-diabetes     Patient Active Problem List   Diagnosis Date Noted   Hyperlipidemia associated with type 2 diabetes mellitus ([facility]) 04/03/2022   Vitamin D deficiency 01/02/2022   Type 2 diabetes mellitus with hyperglycemia, with long-term current use of insulin ([facility]) 11/09/2018   Morbid obesity with BMI of 40.0-44.9, adult ([facility]) 09/28/2018   Adult ADHD 04/09/2016   Anxiety and depression 04/09/2016    Past Surgical History:  Procedure Laterality Date   COLONOSCOPY  03/2019   GANGLION CYST EXCISION     GANGLION CYST EXCISION Right 09/19/2021   Procedure: Right dorsal wrist ganglion cyst excision;  Surgeon: Molli Angelucci, MD;  Location: [facility] ORS;  Service: Orthopedics;  Laterality: Right;   PILONIDAL CYST EXCISION N/A 04/17/2016   Procedure: CYST EXCISION PILONIDAL EXTENSIVE;  Surgeon: Benancio Bracket, MD;  Location: [facility] OR[facility];  Service: General;  Laterality: N/A;   TONSILLECTOMY     WISDOM TOOTH EXTRACTION      OB History     Gravida  0   Para  0   Term  0   Preterm  0   AB  0   Living  0      SAB  0   IAB  0   Ectopic  0   Multiple  0   Live Births  0             Home Medications    Prior to Admission medications   Medication Sig Start Date End Date Taking? Authorizing Provider  lidocaine  (XYLOCAINE ) 2 % solution Use as directed 15 mLs in the mouth or throat every 4 (four) hours as needed. 12/28/23  Yes Steele Ledonne R, NP  predniSONE  (DELTASONE ) 20 MG tablet Take 2 tablets (40 mg total) by mouth daily. 12/28/23  Yes Shuntell Foody R, NP  atorvastatin (LIPITOR) 10 MG tablet Take 10 mg by mouth daily.    [provider]  cephALEXin  (KEFLEX ) 500 MG capsule Take 1 capsule (500 mg total) by mouth 4 (four) times daily. 04/29/22   Angelita Kendall, CNM  citalopram (CELEXA) 20 MG tablet Take 20 mg by mouth daily. 04/20/22   [provider]  etonogestrel  (NEXPLANON ) 68 MG IMPL implant 68 mg by Subdermal route once.    [provider]  MOUNJARO 5 MG/0.5ML Pen Inject into the skin once a week. 04/09/22   [provider]  Multiple Vitamins-Minerals (AIRBORNE PO) Take 1 tablet by mouth daily as needed (cold symptoms).    [provider]  ondansetron  (ZOFRAN -ODT) 4 MG disintegrating tablet  Take 1 tablet (4 mg total) by mouth every 8 (eight) hours as needed for nausea or vomiting. 10/27/23   Shane Darling, MD  triamcinolone cream (KENALOG) 0.1 % Apply 1 application topically daily as needed (eczema).    [provider]  Vitamin D, Ergocalciferol, (DRISDOL) 1.25 MG (50000 UNIT) CAPS capsule Take 50,000 Units by mouth once a week. 04/04/22   [provider]    Family History Family History  Adopted: Yes  Problem Relation Age of Onset   Testicular cancer Maternal Uncle    Diabetes Maternal Grandmother    Uterine cancer Maternal Grandmother     Social History Social History   Tobacco Use   Smoking status: Former    Types: E-cigarettes    Quit date: 02/08/2022    Years since quitting: 1.8   Smokeless tobacco: Never   Tobacco comments:    vape 1x every other day  Vaping Use   Vaping  status: Some Days   Substances: Nicotine, CBD  Substance Use Topics   Alcohol use: No    Alcohol/week: 0.0 standard drinks of alcohol   Drug use: No     Allergies   Patient has no known allergies.   Review of Systems Review of Systems   Physical Exam Triage Vital Signs ED Triage Vitals  Encounter Vitals Group     BP 12/28/23 1106 107/68     Systolic BP Percentile --      Diastolic BP Percentile --      Pulse Rate 12/28/23 1106 (!) 105     Resp 12/28/23 1106 18     Temp 12/28/23 1106 98.2 F (36.8 C)     Temp Source 12/28/23 1106 Oral     SpO2 12/28/23 1106 98 %     Weight --      Height --      Head Circumference --      Peak Flow --      Pain Score 12/28/23 1104 8     Pain Loc --      Pain Education --      Exclude from Growth Chart --    No data found.  Updated Vital Signs BP 107/68 (BP Location: Right Arm)   Pulse (!) 105   Temp 98.2 F (36.8 C) (Oral)   Resp 18   LMP 12/12/2023   SpO2 98%   Visual Acuity Right Eye Distance:   Left Eye Distance:   Bilateral Distance:    Right Eye Near:   Left Eye Near:    Bilateral Near:     Physical Exam Constitutional:      Appearance: Normal appearance.  HENT:     Head: Normocephalic.     Right Ear: Tympanic membrane, ear canal and external ear normal.     Left Ear: Tympanic membrane, ear canal and external ear normal.     Nose: Congestion present.     Mouth/Throat:     Pharynx: No oropharyngeal exudate or posterior oropharyngeal erythema.  Eyes:     Extraocular Movements: Extraocular movements intact.  Pulmonary:     Effort: Pulmonary effort is normal.  Neurological:     Mental Status: She is alert and oriented to person, place, and time. Mental status is at baseline.      UC Treatments / Results  Labs (all labs ordered are listed, but only abnormal results are displayed) Labs Reviewed  POCT RAPID STREP A (OFFICE) - Normal  CULTURE, GROUP A STREP [facility])  EKG   Radiology No results  found.  Procedures Procedures (including critical care time)  Medications Ordered in UC Medications - No data to display  Initial Impression / Assessment and Plan / UC Course  I have reviewed the triage vital signs and the nursing notes.  Pertinent labs & imaging results that were available during my care of the patient were reviewed by me and considered in my medical decision making (see chart for details).  viral illness, sore throat  Patient is in no signs of distress nor toxic appearing.  Vital signs are stable.  Low suspicion for pneumonia, pneumothorax or bronchitis and therefore will defer imaging.  Rapid strep test negative, sent for culture.  Prescribed prednisone  and viscous lidocaine .May use additional over-the-counter medications as needed for supportive care.  May follow-up with urgent care as needed if symptoms persist or worsen.   Final Clinical Impressions(s) / UC Diagnoses   Final diagnoses:  Viral illness  Sore throat   Discharge Instructions      Your symptoms today are most likely being caused by a virus and should steadily improve in time it can take up to 7 to 10 days before you truly start to see a turnaround however things will get better  Rapid strep test is negative, has been sent to the lab to see if bacteria will grow, if this occurs you will be notified and antibiotic started  Begin prednisone  every morning with food to help reduce inflammation pain, may take Tylenol  in addition  May gargle and spit lidocaine  solution as needed for temporary relief to your throat   For cough: honey 1/2 to 1 teaspoon (you can dilute the honey in water or another fluid).  You can also use guaifenesin and dextromethorphan for cough. You can use a humidifier for chest congestion and cough.  If you don't have a humidifier, you can sit in the bathroom with the hot shower running.      For sore throat: try warm salt water gargles, cepacol lozenges, throat spray, warm tea or  water with lemon/honey, popsicles or ice, or OTC cold relief medicine for throat discomfort.   For congestion: take a daily anti-histamine like Zyrtec, Claritin, and a oral decongestant, such as pseudoephedrine.  You can also use Flonase 1-2 sprays in each nostril daily.   It is important to stay hydrated: drink plenty of fluids (water, gatorade/powerade/pedialyte, juices, or teas) to keep your throat moisturized and help further relieve irritation/discomfort.   ED Prescriptions     Medication Sig Dispense Auth. Provider   predniSONE  (DELTASONE ) 20 MG tablet Take 2 tablets (40 mg total) by mouth daily. 10 tablet Beza Steppe R, NP   lidocaine  (XYLOCAINE ) 2 % solution Use as directed 15 mLs in the mouth or throat every 4 (four) hours as needed. 100 mL Reena Canning, NP      PDMP not reviewed this encounter.   Reena Canning, NP 12/28/23 1137

## 2023-12-31 ENCOUNTER — Ambulatory Visit (HOSPITAL_COMMUNITY): Payer: Self-pay

## 2023-12-31 LAB — CULTURE, GROUP A STREP (THRC)

## 2024-01-12 ENCOUNTER — Ambulatory Visit: Admitting: Obstetrics

## 2024-01-15 NOTE — Patient Instructions (Signed)
 Preventive Care 55-27 Years Old, Female Preventive care refers to lifestyle choices and visits with your health care provider that can promote health and wellness. Preventive care visits are also called wellness exams. What can I expect for my preventive care visit? Counseling During your preventive care visit, your health care provider may ask about your: Medical history, including: Past medical problems. Family medical history. Pregnancy history. Current health, including: Menstrual cycle. Method of birth control. Emotional well-being. Home life and relationship well-being. Sexual activity and sexual health. Lifestyle, including: Alcohol, nicotine or tobacco, and drug use. Access to firearms. Diet, exercise, and sleep habits. Work and work Astronomer. Sunscreen use. Safety issues such as seatbelt and bike helmet use. Physical exam Your health care provider may check your: Height and weight. These may be used to calculate your BMI (body mass index). BMI is a measurement that tells if you are at a healthy weight. Waist circumference. This measures the distance around your waistline. This measurement also tells if you are at a healthy weight and may help predict your risk of certain diseases, such as type 2 diabetes and high blood pressure. Heart rate and blood pressure. Body temperature. Skin for abnormal spots. What immunizations do I need?  Vaccines are usually given at various ages, according to a schedule. Your health care provider will recommend vaccines for you based on your age, medical history, and lifestyle or other factors, such as travel or where you work. What tests do I need? Screening Your health care provider may recommend screening tests for certain conditions. This may include: Pelvic exam and Pap test. Lipid and cholesterol levels. Diabetes screening. This is done by checking your blood sugar (glucose) after you have not eaten for a while (fasting). Hepatitis  B test. Hepatitis C test. HIV (human immunodeficiency virus) test. STI (sexually transmitted infection) testing, if you are at risk. BRCA-related cancer screening. This may be done if you have a family history of breast, ovarian, tubal, or peritoneal cancers. Talk with your health care provider about your test results, treatment options, and if necessary, the need for more tests. Follow these instructions at home: Eating and drinking  Eat a healthy diet that includes fresh fruits and vegetables, whole grains, lean protein, and low-fat dairy products. Take vitamin and mineral supplements as recommended by your health care provider. Do not drink alcohol if: Your health care provider tells you not to drink. You are pregnant, may be pregnant, or are planning to become pregnant. If you drink alcohol: Limit how much you have to 0-1 drink a day. Know how much alcohol is in your drink. In the [place] one drink equals one 12 oz bottle of beer (355 mL), one 5 oz glass of wine (148 mL), or one 1 oz glass of hard liquor (44 mL). Lifestyle Brush your teeth every morning and night with fluoride toothpaste. Floss one time each day. Exercise for at least 30 minutes 5 or more days each week. Do not use any products that contain nicotine or tobacco. These products include cigarettes, chewing tobacco, and vaping devices, such as e-cigarettes. If you need help quitting, ask your health care provider. Do not use drugs. If you are sexually active, practice safe sex. Use a condom or other form of protection to prevent STIs. If you do not wish to become pregnant, use a form of birth control. If you plan to become pregnant, see your health care provider for a prepregnancy visit. Find healthy ways to manage stress, such as: Meditation,  yoga, or listening to music. Journaling. Talking to a trusted person. Spending time with friends and family. Minimize exposure to UV radiation to reduce your risk of skin  cancer. Safety Always wear your seat belt while driving or riding in a vehicle. Do not drive: If you have been drinking alcohol. Do not ride with someone who has been drinking. If you have been using any mind-altering substances or drugs. While texting. When you are tired or distracted. Wear a helmet and other protective equipment during sports activities. If you have firearms in your house, make sure you follow all gun safety procedures. Seek help if you have been physically or sexually abused. What's next? Go to your health care provider once a year for an annual wellness visit. Ask your health care provider how often you should have your eyes and teeth checked. Stay up to date on all vaccines. This information is not intended to replace advice given to you by your health care provider. Make sure you discuss any questions you have with your health care provider. Document Revised: 01/23/2021 Document Reviewed: 01/23/2021 Elsevier Patient Education  2024 Elsevier Inc. Breast Self-Awareness Breast self-awareness is knowing how your breasts look and feel. You need to: Check your breasts on a regular basis. Tell your doctor about any changes. Become familiar with the look and feel of your breasts. This can help you catch a breast problem while it is still small and can be treated. You should do breast self-exams even if you have breast implants. What you need: A mirror. A well-lit room. A pillow or other soft object. How to do a breast self-exam Follow these steps to do a breast self-exam: Look for changes  Take off all the clothes above your waist. Stand in front of a mirror in a room with good lighting. Put your hands down at your sides. Compare your breasts in the mirror. Look for any difference between them, such as: A difference in shape. A difference in size. Wrinkles, dips, and bumps in one breast and not the other. Look at each breast for changes in the skin, such  as: Redness. Scaly areas. Skin that has gotten thicker. Dimpling. Open sores (ulcers). Look for changes in your nipples, such as: Fluid coming out of a nipple. Fluid around a nipple. Bleeding. Dimpling. Redness. A nipple that looks pushed in (retracted), or that has changed position. Feel for changes Lie on your back. Feel each breast. To do this: Pick a breast to feel. Place a pillow under the shoulder closest to that breast. Put the arm closest to that breast behind your head. Feel the nipple area of that breast using the hand of your other arm. Feel the area with the pads of your three middle fingers by making small circles with your fingers. Use light, medium, and firm pressure. Continue the overlapping circles, moving downward over the breast. Keep making circles with your fingers. Stop when you feel your ribs. Start making circles with your fingers again, this time going upward until you reach your collarbone. Then, make circles outward across your breast and into your armpit area. Squeeze your nipple. Check for discharge and lumps. Repeat these steps to check your other breast. Sit or stand in the tub or shower. With soapy water on your skin, feel each breast the same way you did when you were lying down. Write down what you find Writing down what you find can help you remember what to tell your doctor. Write down: What is  normal for each breast. Any changes you find in each breast. These include: The kind of changes you find. A tender or painful breast. Any lump you find. Write down its size and where it is. When you last had your monthly period (menstrual cycle). General tips If you are breastfeeding, the best time to check your breasts is after you feed your baby or after you use a breast pump. If you get monthly bleeding, the best time to check your breasts is 5-7 days after your monthly cycle ends. With time, you will become comfortable with the self-exam. You will  also start to know if there are changes in your breasts. Contact a doctor if: You see a change in the shape or size of your breasts or nipples. You see a change in the skin of your breast or nipples, such as red or scaly skin. You have fluid coming from your nipples that is not normal. You find a new lump or thick area. You have breast pain. You have any concerns about your breast health. Summary Breast self-awareness includes looking for changes in your breasts and feeling for changes within your breasts. You should do breast self-awareness in front of a mirror in a well-lit room. If you get monthly periods (menstrual cycles), the best time to check your breasts is 5-7 days after your period ends. Tell your doctor about any changes you see in your breasts. Changes include changes in size, changes on the skin, painful or tender breasts, or fluid from your nipples that is not normal. This information is not intended to replace advice given to you by your health care provider. Make sure you discuss any questions you have with your health care provider. Document Revised: 01/02/2022 Document Reviewed: 05/30/2021 Elsevier Patient Education  2024 [employer].

## 2024-01-15 NOTE — Progress Notes (Signed)
 GYNECOLOGY ANNUAL PHYSICAL EXAM PROGRESS NOTE  Subjective:    Allison Austin is a 28 y.o. G0P0000 female who presents for an annual exam.  The patient is sexually active. The patient participates in regular exercise: yes. Has the patient ever been transfused or tattooed?: yes. The patient reports that there is not domestic violence in her life.   The patient has the following updates today: She has had her Nexplanon  for a little over three years and it is time to have it removed and replaced.  Recently lost weight with help of diabetic medications. Has regular care and labs done with PCP.   Menstrual History: Menarche age: 35 Patient's last menstrual period was 01/13/2024 (exact date). Period Cycle (Days): 28 Period Duration (Days): 4-6 Period Pattern: Regular Menstrual Flow: Moderate Menstrual Control: Maxi pad, Tampon Menstrual Control Change Freq (Hours): 2-3 Dysmenorrhea: (!) Moderate Dysmenorrhea Symptoms: Cramping, Headache   Gynecologic History:  Contraception: Nexplanon  History of STI's: Denies Last Pap: 11/02/2020. Results were: normal. Denies h/o abnormal pap smears. Last mammogram: Not age appropriate    OB History  Gravida Para Term Preterm AB Living  0 0 0 0 0 0  SAB IAB Ectopic Multiple Live Births  0 0 0 0 0    Past Medical History:  Diagnosis Date   ADHD (attention deficit hyperactivity disorder)    Adopted    Anxiety    Back pain    Depression    Diabetes mellitus without complication (HCC) 2019   Frequent headaches    GERD (gastroesophageal reflux disease)    OCC- NO MEDS   Pre-diabetes     Past Surgical History:  Procedure Laterality Date   COLONOSCOPY  03/2019   GANGLION CYST EXCISION     GANGLION CYST EXCISION Right 09/19/2021   Procedure: Right dorsal wrist ganglion cyst excision;  Surgeon: Molli Angelucci, MD;  Location: [facility] ORS;  Service: Orthopedics;  Laterality: Right;   PILONIDAL CYST EXCISION N/A 04/17/2016   Procedure: CYST  EXCISION PILONIDAL EXTENSIVE;  Surgeon: Benancio Bracket, MD;  Location: [facility] ORS;  Service: General;  Laterality: N/A;   TONSILLECTOMY     WISDOM TOOTH EXTRACTION      Family History  Adopted: Yes  Problem Relation Age of Onset   Testicular cancer Maternal Uncle    Diabetes Maternal Grandmother    Uterine cancer Maternal Grandmother     Social History   Socioeconomic History   Marital status: Single    Spouse name: Not on file   Number of children: Not on file   Years of education: Not on file   Highest education level: Not on file  Occupational History   Not on file  Tobacco Use   Smoking status: Former    Types: E-cigarettes    Quit date: 02/08/2022    Years since quitting: 1.9   Smokeless tobacco: Never   Tobacco comments:    vape 1x every other day  Vaping Use   Vaping status: Some Days   Substances: Nicotine, CBD  Substance and Sexual Activity   Alcohol use: No    Alcohol/week: 0.0 standard drinks of alcohol   Drug use: No   Sexual activity: Yes    Birth control/protection: Implant  Other Topics Concern   Not on file  Social History Narrative   Not on file   Social Drivers of Health   Financial Resource Strain: Medium Risk (12/31/2023)   Received from [facility]   Overall Financial Resource Strain (CARDIA)  Difficulty of Paying Living Expenses: Somewhat hard  Food Insecurity: No Food Insecurity (12/31/2023)   Received from [facility]   Hunger Vital Sign    Worried About Running Out of Food in the Last Year: Never true    Ran Out of Food in the Last Year: Never true  Transportation Needs: No Transportation Needs (12/31/2023)   Received from [facility] - Transportation    In the past 12 months, has lack of transportation kept you from medical appointments or from getting medications?: No    Lack of Transportation (Non-Medical): No  Physical Activity: Inactive (02/21/2019)   Exercise  Vital Sign    Days of Exercise per Week: 0 days    Minutes of Exercise per Session: 0 min  Stress: No Stress Concern Present (02/21/2019)   [employer] of Occupational Health - Occupational Stress Questionnaire    Feeling of Stress : Only a little  Social Connections: Somewhat Isolated (02/21/2019)   Social Connection and Isolation Panel [NHANES]    Frequency of Communication with Friends and Family: More than three times a week    Frequency of Social Gatherings with Friends and Family: More than three times a week    Attends Religious Services: More than 4 times per year    Active Member of [employer] or Organizations: No    Attends Banker Meetings: Never    Marital Status: Never married  Intimate Partner Violence: Not At Risk (02/21/2019)   Humiliation, Afraid, Rape, and Kick questionnaire    Fear of Current or Ex-Partner: No    Emotionally Abused: No    Physically Abused: No    Sexually Abused: No    Current Outpatient Medications on File Prior to Visit  Medication Sig Dispense Refill   atorvastatin (LIPITOR) 10 MG tablet Take 10 mg by mouth daily.     cephALEXin  (KEFLEX ) 500 MG capsule Take 1 capsule (500 mg total) by mouth 4 (four) times daily. 28 capsule 0   citalopram (CELEXA) 20 MG tablet Take 20 mg by mouth daily.     etonogestrel  (NEXPLANON ) 68 MG IMPL implant 68 mg by Subdermal route once.     lidocaine  (XYLOCAINE ) 2 % solution Use as directed 15 mLs in the mouth or throat every 4 (four) hours as needed. 100 mL 0   MOUNJARO 5 MG/0.5ML Pen Inject into the skin once a week.     Multiple Vitamins-Minerals (AIRBORNE PO) Take 1 tablet by mouth daily as needed (cold symptoms).     ondansetron  (ZOFRAN -ODT) 4 MG disintegrating tablet Take 1 tablet (4 mg total) by mouth every 8 (eight) hours as needed for nausea or vomiting. 20 tablet 0   predniSONE  (DELTASONE ) 20 MG tablet Take 2 tablets (40 mg total) by mouth daily. 10 tablet 0   triamcinolone cream (KENALOG) 0.1 %  Apply 1 application topically daily as needed (eczema).     Vitamin D, Ergocalciferol, (DRISDOL) 1.25 MG (50000 UNIT) CAPS capsule Take 50,000 Units by mouth once a week.     No current facility-administered medications on file prior to visit.    No Known Allergies   Review of Systems Constitutional: negative for chills, fatigue, fevers and sweats Eyes: negative for irritation, redness and visual disturbance Ears, nose, mouth, throat, and face: negative for hearing loss, nasal congestion, snoring and tinnitus Respiratory: negative for asthma, cough, sputum Cardiovascular: negative for chest pain, dyspnea, exertional chest pressure/discomfort, irregular heart beat, palpitations and syncope Gastrointestinal: negative for  abdominal pain, change in bowel habits, nausea and vomiting Genitourinary: negative for abnormal menstrual periods, genital lesions, sexual problems and vaginal discharge, dysuria and urinary incontinence Integument/breast: negative for breast lump, breast tenderness and nipple discharge Hematologic/lymphatic: negative for bleeding and easy bruising Musculoskeletal:negative for back pain and muscle weakness Neurological: negative for dizziness, headaches, vertigo and weakness Endocrine: negative for diabetic symptoms including polydipsia, polyuria and skin dryness Allergic/Immunologic: negative for hay fever and urticaria      Objective:  Blood pressure 102/68, pulse 89, resp. rate 16, height 5\' 2"  (1.575 m), weight 234 lb 11.2 oz (106.5 kg), last menstrual period 01/13/2024. Body mass index is 42.93 kg/m.    General Appearance:    Alert, cooperative, no distress, appears stated age  Head:    Normocephalic, without obvious abnormality, atraumatic  Eyes:    PERRL, conjunctiva/corneas clear, EOM's intact, both eyes  Ears:    Normal external ear canals, both ears  Nose:   Nares normal, septum midline, mucosa normal, no drainage or sinus tenderness  Throat:   Lips,  mucosa, and tongue normal; teeth and gums normal  Neck:   Supple, symmetrical, trachea midline, no adenopathy; thyroid: no enlargement/tenderness/nodules; no carotid bruit or JVD  Back:     Symmetric, no curvature, ROM normal, no CVA tenderness  Lungs:     Clear to auscultation bilaterally, respirations unlabored  Chest Wall:    No tenderness or deformity   Heart:    Regular rate and rhythm, S1 and S2 normal, no murmur, rub or gallop  Breast Exam:    No tenderness, masses, or nipple abnormality  Abdomen:     Soft, non-tender, bowel sounds active all four quadrants, no masses, no organomegaly.    Genitalia:    Pelvic:external genitalia normal, vagina without lesions, discharge, or tenderness, rectovaginal septum  normal. Cervix normal in appearance, no cervical motion tenderness, no adnexal masses or tenderness.  Uterus normal size, shape, mobile, regular contours, nontender.  Rectal:    Normal external sphincter.  No hemorrhoids appreciated. Internal exam not done.   Extremities:   Extremities normal, atraumatic, no cyanosis or edema  Pulses:   2+ and symmetric all extremities  Skin:   Skin color, texture, turgor normal, no rashes or lesions  Lymph nodes:   Cervical, supraclavicular, and axillary nodes normal  Neurologic:   CNII-XII intact, normal strength, sensation and reflexes throughout   .  Labs:  Lab Results  Component Value Date   WBC 20.1 (H) 10/27/2023   HGB 15.2 (H) 10/27/2023   HCT 42.7 10/27/2023   MCV 86.8 10/27/2023   PLT 323 10/27/2023    Lab Results  Component Value Date   CREATININE 0.65 10/27/2023   BUN 15 10/27/2023   NA 136 10/27/2023   K 3.8 10/27/2023   CL 109 10/27/2023   CO2 18 (L) 10/27/2023    Lab Results  Component Value Date   ALT 20 10/27/2023   AST 26 10/27/2023   ALKPHOS 60 10/27/2023   BILITOT 0.8 10/27/2023    Lab Results  Component Value Date   TSH 0.839 11/24/2019     Assessment:   1. Encounter for well woman exam with routine  gynecological exam   2. Cervical cancer screening   3. Screen for STD (sexually transmitted disease)   4. Screening for diabetes mellitus (DM)   5. Encounter for removal and reinsertion of Nexplanon       Plan:   Nexplanon  Removal and Reinsertion Patient identified, informed consent performed, consent signed.  Patient does understand that irregular bleeding is a very common side effect of this medication. She was advised to have backup contraception for one week after replacement of the implant. Pregnancy test in clinic today was negative.  Appropriate time out taken. Nexplanon  site identified in left arm.  Area prepped in usual sterile fashon. One ml of 1% lidocaine  was used to anesthetize the area at the distal end of the implant. A small stab incision was made right beside the implant on the distal portion. The Nexplanon  rod was grasped using hemostats and removed without difficulty. There was minimal blood loss. There were no complications. Area was then injected with 3 ml of 1 % lidocaine . She was re-prepped with betadine, Nexplanon  removed from packaging, Device confirmed in needle, then inserted full length of needle and withdrawn per handbook instructions. Nexplanon  was able to palpated in the patient's arm; patient palpated the insert herself.  There was minimal blood loss. Patient insertion site covered with gauze and a pressure bandage to reduce any bruising. The patient tolerated the procedure well and was given post procedure instructions.  She was advised to have backup contraception for one week.     Blood tests: Pending. Breast self exam technique reviewed and patient encouraged to perform self-exam monthly. Contraception: Nexplanon . Discussed healthy lifestyle modifications. Mammogram : Not age appropriate Pap smear ordered. Flu vaccine: 04/28/2023 Follow up in 1 year for annual exam     Donato Fu, CNM [facility]

## 2024-01-19 ENCOUNTER — Ambulatory Visit: Admitting: Certified Nurse Midwife

## 2024-01-19 ENCOUNTER — Other Ambulatory Visit (HOSPITAL_COMMUNITY)
Admission: RE | Admit: 2024-01-19 | Discharge: 2024-01-19 | Disposition: A | Discharge status: Home / Self Care | Source: Ambulatory Visit | Attending: Certified Nurse Midwife | Admitting: Certified Nurse Midwife

## 2024-01-19 ENCOUNTER — Encounter: Payer: Self-pay | Admitting: Certified Nurse Midwife

## 2024-01-19 VITALS — BP 102/68 | HR 89 | Resp 16 | Ht 62.0 in | Wt 234.7 lb

## 2024-01-19 DIAGNOSIS — Z01419 Encounter for gynecological examination (general) (routine) without abnormal findings: Secondary | ICD-10-CM | POA: Diagnosis present

## 2024-01-19 DIAGNOSIS — Z3046 Encounter for surveillance of implantable subdermal contraceptive: Secondary | ICD-10-CM | POA: Diagnosis not present

## 2024-01-19 DIAGNOSIS — Z124 Encounter for screening for malignant neoplasm of cervix: Secondary | ICD-10-CM | POA: Insufficient documentation

## 2024-01-19 DIAGNOSIS — Z131 Encounter for screening for diabetes mellitus: Secondary | ICD-10-CM

## 2024-01-19 DIAGNOSIS — Z113 Encounter for screening for infections with a predominantly sexual mode of transmission: Secondary | ICD-10-CM

## 2024-01-19 MED ORDER — ETONOGESTREL 68 MG ~~LOC~~ IMPL
68.0000 mg | DRUG_IMPLANT | Freq: Once | SUBCUTANEOUS | Status: AC
  Administered 2024-01-19: 68 mg via SUBCUTANEOUS

## 2024-01-19 NOTE — Addendum Note (Signed)
 Addended by: Donato Fu on: 01/19/2024 01:34 PM   Modules accepted: Level of Service

## 2024-01-20 ENCOUNTER — Ambulatory Visit: Payer: Self-pay | Admitting: Certified Nurse Midwife

## 2024-01-20 LAB — CYTOLOGY - PAP
Adequacy: ABSENT
Diagnosis: NEGATIVE

## 2024-01-20 LAB — HSV 1 AND 2 AB, IGG
HSV 1 Glycoprotein G Ab, IgG: REACTIVE — AB
HSV 2 IgG, Type Spec: NONREACTIVE

## 2024-01-20 LAB — HEPATITIS B SURFACE ANTIGEN: Hepatitis B Surface Ag: NEGATIVE

## 2024-01-20 LAB — HIV ANTIBODY (ROUTINE TESTING W REFLEX): HIV Screen 4th Generation wRfx: NONREACTIVE

## 2024-01-20 LAB — RPR: RPR Ser Ql: NONREACTIVE

## 2024-01-20 LAB — HEPATITIS C ANTIBODY: Hep C Virus Ab: NONREACTIVE

## 2024-01-21 LAB — CERVICOVAGINAL ANCILLARY ONLY
Chlamydia: NEGATIVE
Comment: NEGATIVE
Comment: NEGATIVE
Comment: NORMAL
Neisseria Gonorrhea: NEGATIVE
Trichomonas: NEGATIVE

## 2024-04-20 ENCOUNTER — Other Ambulatory Visit: Payer: Self-pay

## 2024-04-20 ENCOUNTER — Emergency Department
Admission: EM | Admit: 2024-04-20 | Discharge: 2024-04-20 | Disposition: A | Discharge status: Home / Self Care | Attending: Emergency Medicine | Admitting: Emergency Medicine

## 2024-04-20 DIAGNOSIS — R55 Syncope and collapse: Secondary | ICD-10-CM | POA: Insufficient documentation

## 2024-04-20 DIAGNOSIS — W19XXXA Unspecified fall, initial encounter: Secondary | ICD-10-CM | POA: Diagnosis not present

## 2024-04-20 DIAGNOSIS — Z794 Long term (current) use of insulin: Secondary | ICD-10-CM | POA: Diagnosis not present

## 2024-04-20 DIAGNOSIS — E119 Type 2 diabetes mellitus without complications: Secondary | ICD-10-CM | POA: Insufficient documentation

## 2024-04-20 LAB — COMPREHENSIVE METABOLIC PANEL WITH GFR
ALT: 11 U/L (ref 0–44)
AST: 16 U/L (ref 15–41)
Albumin: 4.2 g/dL (ref 3.5–5.0)
Alkaline Phosphatase: 78 U/L (ref 38–126)
Anion gap: 10 (ref 5–15)
BUN: 8 mg/dL (ref 6–20)
CO2: 22 mmol/L (ref 22–32)
Calcium: 9.2 mg/dL (ref 8.9–10.3)
Chloride: 107 mmol/L (ref 98–111)
Creatinine, Ser: 0.65 mg/dL (ref 0.44–1.00)
GFR, Estimated: >60 mL/min (ref >60)
Glucose, Bld: 80 mg/dL (ref 70–99)
Potassium: 3.9 mmol/L (ref 3.5–5.1)
Sodium: 139 mmol/L (ref 135–145)
Total Bilirubin: 0.6 mg/dL (ref 0.0–1.2)
Total Protein: 7.9 g/dL (ref 6.5–8.1)

## 2024-04-20 LAB — CBC
HCT: 41.1 % (ref 36.0–46.0)
Hemoglobin: 13.9 g/dL (ref 12.0–15.0)
MCH: 29.8 pg (ref 26.0–34.0)
MCHC: 33.8 g/dL (ref 30.0–36.0)
MCV: 88 fL (ref 80.0–100.0)
Platelets: 348 K/uL (ref 150–400)
RBC: 4.67 MIL/uL (ref 3.87–5.11)
RDW: 12.2 % (ref 11.5–15.5)
WBC: 8.7 K/uL (ref 4.0–10.5)
nRBC: 0 % (ref 0.0–0.2)

## 2024-04-20 LAB — URINALYSIS, ROUTINE W REFLEX MICROSCOPIC
Bilirubin Urine: NEGATIVE
Glucose, UA: NEGATIVE mg/dL
Hgb urine dipstick: NEGATIVE
Ketones, ur: NEGATIVE mg/dL
Leukocytes,Ua: NEGATIVE
Nitrite: NEGATIVE
Protein, ur: NEGATIVE mg/dL
Specific Gravity, Urine: 1.012 (ref 1.005–1.030)
pH: 6 (ref 5.0–8.0)

## 2024-04-20 LAB — PREGNANCY, URINE: Preg Test, Ur: NEGATIVE

## 2024-04-20 LAB — CBG MONITORING, ED: Glucose-Capillary: 83 mg/dL (ref 70–99)

## 2024-04-20 NOTE — Discharge Instructions (Signed)
Please follow-up with your outpatient provider.  Please return for any new, worsening, or change in symptoms or other concerns.  It was a pleasure caring for you today. 

## 2024-04-20 NOTE — ED Triage Notes (Signed)
 Pt arrives via [facility] from school by POV after a syncopal episode around 1140. Pt has fainted 2 before. Pt denies hitting their head, pain. Pt does have a slight headache. Pt is A&Ox4 and ambulatory during triage.

## 2024-04-20 NOTE — ED Triage Notes (Signed)
 First Nurse Note: Patient to ED from [facility] for syncopal episode. Pt reports hitting her head. KC reports pt has not been taking her medication all summer until the past 2 weeks. States hx of diabetes- KC reports they did not have glucometer on their side to check cbg.

## 2024-04-20 NOTE — ED Provider Notes (Signed)
 [facility] Provider Note    Event Date/Time   First MD Initiated Contact with Patient 04/20/24 1549     (approximate)   History   Loss of Consciousness   HPI  Allison Austin is a 28 y.o. female with a past medical history of type 2 diabetes, hyperlipidemia, anxiety, depression, who presents today for evaluation after syncopal episode that occurred this morning.  Patient reports that she stood up and felt tunnel vision and fell backwards.  She thinks that she was unconscious for approximately 1 second. She reports that she was wearing her backpack and this caught her.  She denies head strike.  She reports that this has happened to her twice before and she was evaluated in the emergency department and diagnosed with vasovagal syndrome.  She denies having had any chest pain, palpitations, or shortness of breath.  She has not had any abdominal pain, nausea, vomiting, diaphoresis, vaginal discharge or bleeding.  Denies chance of pregnancy.  She denies any preceding headache.  She denies any complaints currently.  She reports that she told her doctor what happened and they advised that she come to the emergency department for evaluation.  Patient reports I feel fine, I do not need to be here.  She denies tongue biting or incontinence.  Patient Active Problem List   Diagnosis Date Noted   Hyperlipidemia associated with type 2 diabetes mellitus (HCC) 04/03/2022   Vitamin D deficiency 01/02/2022   Type 2 diabetes mellitus with hyperglycemia, with long-term current use of insulin (HCC) 11/09/2018   Morbid obesity with BMI of 40.0-44.9, adult (HCC) 09/28/2018   Adult ADHD 04/09/2016   Anxiety and depression 04/09/2016          Physical Exam   Triage Vital Signs: ED Triage Vitals  Encounter Vitals Group     BP 04/20/24 1439 118/81     Girls Systolic BP Percentile --      Girls Diastolic BP Percentile --      Boys Systolic BP Percentile --      Boys Diastolic  BP Percentile --      Pulse Rate 04/20/24 1439 76     Resp 04/20/24 1439 16     Temp 04/20/24 1439 98.4 F (36.9 C)     Temp Source 04/20/24 1439 Oral     SpO2 04/20/24 1439 98 %     Weight 04/20/24 1441 240 lb (108.9 kg)     Height 04/20/24 1441 5' 2 (1.575 m)     Head Circumference --      Peak Flow --      Pain Score 04/20/24 1440 0     Pain Loc --      Pain Education --      Exclude from Growth Chart --     Most recent vital signs: Vitals:   04/20/24 1439  BP: 118/81  Pulse: 76  Resp: 16  Temp: 98.4 F (36.9 C)  SpO2: 98%    Physical Exam Vitals and nursing note reviewed.  Constitutional:      General: Awake and alert. No acute distress.    Appearance: Normal appearance. The patient is normal weight.  HENT:     Head: Normocephalic and atraumatic.     Mouth: Mucous membranes are moist.  Eyes:     General: PERRL. Normal EOMs        Right eye: No discharge.        Left eye: No discharge.     Conjunctiva/sclera:  Conjunctivae normal.  Cardiovascular:     Rate and Rhythm: Normal rate and regular rhythm.     Pulses: Normal pulses.  Pulmonary:     Effort: Pulmonary effort is normal. No respiratory distress.     Breath sounds: Normal breath sounds.  Abdominal:     Abdomen is soft. There is no abdominal tenderness. No rebound or guarding. No distention. Musculoskeletal:        General: No swelling. Normal range of motion.     Cervical back: Normal range of motion and neck supple.  Skin:    General: Skin is warm and dry.     Capillary Refill: Capillary refill takes less than 2 seconds.     Findings: No rash.  Neurological:     Mental Status: The patient is awake and alert.      ED Results / Procedures / Treatments   Labs (all labs ordered are listed, but only abnormal results are displayed) Labs Reviewed  URINALYSIS, ROUTINE W REFLEX MICROSCOPIC - Abnormal; Notable for the following components:      Result Value   Color, Urine STRAW (*)    APPearance  CLEAR (*)    All other components within normal limits  COMPREHENSIVE METABOLIC PANEL WITH GFR  CBC  PREGNANCY, URINE  CBG MONITORING, ED  POC URINE PREG, ED     EKG     RADIOLOGY     PROCEDURES:  Critical Care performed:   Procedures   MEDICATIONS ORDERED IN ED: Medications - No data to display   IMPRESSION / MDM / ASSESSMENT AND PLAN / ED COURSE  I reviewed the triage vital signs and the nursing notes.   Differential diagnosis includes, but is not limited to, vasovagal syndrome, arrhythmia, dehydration, ectopic pregnancy, syncope, symptomatic anemia.  Patient is awake and alert, hemodynamically stable and afebrile.  She is nontoxic in appearance.  No tachycardia, hypoxia, chest pain, pleurisy, hemoptysis, lower extremity pain or swelling, history of PE or DVT to suggest PE as a source of her syncope today.  She also has a Wells score of 0.  No preceding headache to suggest subarachnoid, no headache now contrary to triage note.  No chest pain.  Labs obtained in triage are overall reassuring.  Stable H&H, normal electrolytes, normal kidney function.  Urinalysis does not reveal evidence of infection.  I added on a urine pregnancy which was negative.  I also added on an EKG which reveals sinus rhythm with sinus arrhythmia, normal intervals.  Also reviewed by attending MD.  Recommended close outpatient follow-up and strict return precautions.  Patient understands and agrees with plan.  She was discharged in stable condition.   Patient's presentation is most consistent with acute presentation with potential threat to life or bodily function.     FINAL CLINICAL IMPRESSION(S) / ED DIAGNOSES   Final diagnoses:  Syncope, unspecified syncope type     Rx / DC Orders   ED Discharge Orders     None        Note:  This document was prepared using Dragon voice recognition software and may include unintentional dictation errors.   Alisea Matte E, PA-C 04/20/24  1721    Arlander Charleston, MD 04/20/24 [phone]
